# Patient Record
Sex: Female | Born: 1960 | Race: Black or African American | Hispanic: No | Marital: Married | Smoking: Never smoker
Health system: Southern US, Community
[De-identification: ages and names within clinical notes are randomized; demographics above are authoritative.]

## PROBLEM LIST (undated history)

## (undated) DIAGNOSIS — IMO0002 Reserved for concepts with insufficient information to code with codable children: Secondary | ICD-10-CM

## (undated) DIAGNOSIS — N189 Chronic kidney disease, unspecified: Secondary | ICD-10-CM

## (undated) DIAGNOSIS — I10 Essential (primary) hypertension: Secondary | ICD-10-CM

## (undated) DIAGNOSIS — Z992 Dependence on renal dialysis: Secondary | ICD-10-CM

## (undated) DIAGNOSIS — G4730 Sleep apnea, unspecified: Secondary | ICD-10-CM

## (undated) DIAGNOSIS — D649 Anemia, unspecified: Secondary | ICD-10-CM

## (undated) DIAGNOSIS — Z780 Asymptomatic menopausal state: Secondary | ICD-10-CM

## (undated) DIAGNOSIS — Z972 Presence of dental prosthetic device (complete) (partial): Secondary | ICD-10-CM

## (undated) DIAGNOSIS — E119 Type 2 diabetes mellitus without complications: Secondary | ICD-10-CM

## (undated) DIAGNOSIS — E7800 Pure hypercholesterolemia, unspecified: Secondary | ICD-10-CM

## (undated) HISTORY — DX: Type 2 diabetes mellitus without complications: E11.9

## (undated) HISTORY — DX: Asymptomatic menopausal state: Z78.0

## (undated) HISTORY — DX: Anemia, unspecified: D64.9

## (undated) HISTORY — PX: COLPOSCOPY: SHX161

## (undated) HISTORY — DX: Reserved for concepts with insufficient information to code with codable children: IMO0002

## (undated) HISTORY — PX: TUBAL LIGATION: SHX77

## (undated) HISTORY — DX: Essential (primary) hypertension: I10

## (undated) HISTORY — DX: Pure hypercholesterolemia, unspecified: E78.00

---

## 1999-09-26 DIAGNOSIS — G4733 Obstructive sleep apnea (adult) (pediatric): Secondary | ICD-10-CM | POA: Insufficient documentation

## 2004-05-07 ENCOUNTER — Other Ambulatory Visit: Payer: Self-pay

## 2004-10-19 ENCOUNTER — Ambulatory Visit: Payer: Self-pay

## 2005-09-04 ENCOUNTER — Emergency Department: Payer: Self-pay | Admitting: General Practice

## 2006-01-26 ENCOUNTER — Emergency Department: Payer: Self-pay | Admitting: Emergency Medicine

## 2006-12-20 ENCOUNTER — Ambulatory Visit: Payer: Self-pay | Admitting: Internal Medicine

## 2007-06-24 ENCOUNTER — Ambulatory Visit: Payer: Self-pay | Admitting: Gastroenterology

## 2008-01-14 ENCOUNTER — Ambulatory Visit: Payer: Self-pay | Admitting: Internal Medicine

## 2009-02-08 ENCOUNTER — Ambulatory Visit: Payer: Self-pay | Admitting: Internal Medicine

## 2010-03-26 ENCOUNTER — Emergency Department: Payer: Self-pay | Admitting: Emergency Medicine

## 2010-08-05 ENCOUNTER — Ambulatory Visit: Payer: Self-pay | Admitting: Internal Medicine

## 2012-09-29 ENCOUNTER — Emergency Department: Payer: Self-pay | Admitting: Emergency Medicine

## 2012-10-02 ENCOUNTER — Emergency Department: Payer: Self-pay | Admitting: Emergency Medicine

## 2012-12-03 ENCOUNTER — Ambulatory Visit: Payer: Self-pay

## 2013-01-07 ENCOUNTER — Emergency Department: Payer: Self-pay | Admitting: Emergency Medicine

## 2013-01-07 LAB — URINALYSIS, COMPLETE
Bacteria: NONE SEEN
Bilirubin,UR: NEGATIVE
Ketone: NEGATIVE
Leukocyte Esterase: NEGATIVE
Ph: 5 (ref 4.5–8.0)
Protein: NEGATIVE
RBC,UR: <1 /HPF (ref 0–5)
Squamous Epithelial: 2
WBC UR: <1 /HPF (ref 0–5)

## 2013-01-07 LAB — CBC WITH DIFFERENTIAL/PLATELET
Basophil #: 0 10*3/uL (ref 0.0–0.1)
HCT: 36 % (ref 35.0–47.0)
HGB: 11.9 g/dL — ABNORMAL LOW (ref 12.0–16.0)
Lymphocyte #: 1.8 10*3/uL (ref 1.0–3.6)
MCHC: 33.1 g/dL (ref 32.0–36.0)
MCV: 83 fL (ref 80–100)
Monocyte %: 6.2 %
Neutrophil #: 5 10*3/uL (ref 1.4–6.5)
Platelet: 262 10*3/uL (ref 150–440)
RBC: 4.31 10*6/uL (ref 3.80–5.20)
RDW: 14.4 % (ref 11.5–14.5)

## 2013-01-07 LAB — COMPREHENSIVE METABOLIC PANEL
Albumin: 3.6 g/dL (ref 3.4–5.0)
Alkaline Phosphatase: 107 U/L (ref 50–136)
Bilirubin,Total: 0.2 mg/dL (ref 0.2–1.0)
Calcium, Total: 9.2 mg/dL (ref 8.5–10.1)
Chloride: 106 mmol/L (ref 98–107)
Creatinine: 0.65 mg/dL (ref 0.60–1.30)
EGFR (African American): >60
EGFR (Non-African Amer.): >60
Glucose: 346 mg/dL — ABNORMAL HIGH (ref 65–99)
Potassium: 3.7 mmol/L (ref 3.5–5.1)
SGPT (ALT): 15 U/L (ref 12–78)
Sodium: 139 mmol/L (ref 136–145)
Total Protein: 7.7 g/dL (ref 6.4–8.2)

## 2013-08-13 ENCOUNTER — Ambulatory Visit: Payer: Self-pay

## 2013-11-15 ENCOUNTER — Emergency Department: Payer: Self-pay | Admitting: Emergency Medicine

## 2013-12-08 ENCOUNTER — Ambulatory Visit: Payer: Self-pay | Admitting: Internal Medicine

## 2014-01-08 LAB — URINALYSIS, COMPLETE
BACTERIA: NONE SEEN
Bilirubin,UR: NEGATIVE
Glucose,UR: 50 mg/dL (ref 0–75)
KETONE: NEGATIVE
NITRITE: NEGATIVE
Ph: 5 (ref 4.5–8.0)
SPECIFIC GRAVITY: 1.02 (ref 1.003–1.030)
Squamous Epithelial: 6
WBC UR: 4 /HPF (ref 0–5)

## 2014-01-08 LAB — COMPREHENSIVE METABOLIC PANEL
ALBUMIN: 3.5 g/dL (ref 3.4–5.0)
ALK PHOS: 88 U/L
Anion Gap: 6 — ABNORMAL LOW (ref 7–16)
BUN: 12 mg/dL (ref 7–18)
Bilirubin,Total: 0.3 mg/dL (ref 0.2–1.0)
CO2: 27 mmol/L (ref 21–32)
CREATININE: 0.73 mg/dL (ref 0.60–1.30)
Calcium, Total: 9.1 mg/dL (ref 8.5–10.1)
Chloride: 106 mmol/L (ref 98–107)
Glucose: 219 mg/dL — ABNORMAL HIGH (ref 65–99)
OSMOLALITY: 284 (ref 275–301)
Potassium: 3.4 mmol/L — ABNORMAL LOW (ref 3.5–5.1)
SGOT(AST): 19 U/L (ref 15–37)
SGPT (ALT): 19 U/L (ref 12–78)
SODIUM: 139 mmol/L (ref 136–145)
Total Protein: 7.7 g/dL (ref 6.4–8.2)

## 2014-01-08 LAB — LIPASE, BLOOD: LIPASE: 1237 U/L — AB (ref 73–393)

## 2014-01-08 LAB — CBC WITH DIFFERENTIAL/PLATELET
Basophil #: 0 10*3/uL (ref 0.0–0.1)
Basophil %: 0.3 %
EOS ABS: 0.1 10*3/uL (ref 0.0–0.7)
Eosinophil %: 0.6 %
HCT: 35 % (ref 35.0–47.0)
HGB: 11.6 g/dL — ABNORMAL LOW (ref 12.0–16.0)
LYMPHS ABS: 1.5 10*3/uL (ref 1.0–3.6)
Lymphocyte %: 16 %
MCH: 27.8 pg (ref 26.0–34.0)
MCHC: 33.1 g/dL (ref 32.0–36.0)
MCV: 84 fL (ref 80–100)
MONO ABS: 0.5 x10 3/mm (ref 0.2–0.9)
MONOS PCT: 5.6 %
NEUTROS ABS: 7.1 10*3/uL — AB (ref 1.4–6.5)
Neutrophil %: 77.5 %
Platelet: 291 10*3/uL (ref 150–440)
RBC: 4.17 10*6/uL (ref 3.80–5.20)
RDW: 14.8 % — ABNORMAL HIGH (ref 11.5–14.5)
WBC: 9.1 10*3/uL (ref 3.6–11.0)

## 2014-01-09 ENCOUNTER — Inpatient Hospital Stay: Payer: Self-pay

## 2014-01-09 LAB — COMPREHENSIVE METABOLIC PANEL
ALK PHOS: 82 U/L
AST: 25 U/L (ref 15–37)
Albumin: 3 g/dL — ABNORMAL LOW (ref 3.4–5.0)
Anion Gap: 7 (ref 7–16)
BILIRUBIN TOTAL: 0.3 mg/dL (ref 0.2–1.0)
BUN: 10 mg/dL (ref 7–18)
CHLORIDE: 107 mmol/L (ref 98–107)
Calcium, Total: 8.3 mg/dL — ABNORMAL LOW (ref 8.5–10.1)
Co2: 28 mmol/L (ref 21–32)
Creatinine: 1.13 mg/dL (ref 0.60–1.30)
EGFR (African American): >60
GFR CALC NON AF AMER: 56 — AB
GLUCOSE: 155 mg/dL — AB (ref 65–99)
Osmolality: 285 (ref 275–301)
POTASSIUM: 3.5 mmol/L (ref 3.5–5.1)
SGPT (ALT): 15 U/L (ref 12–78)
Sodium: 142 mmol/L (ref 136–145)
TOTAL PROTEIN: 7.1 g/dL (ref 6.4–8.2)

## 2014-01-09 LAB — CBC WITH DIFFERENTIAL/PLATELET
Basophil #: 0.1 10*3/uL (ref 0.0–0.1)
Basophil %: 0.5 %
EOS PCT: 0.3 %
Eosinophil #: 0 10*3/uL (ref 0.0–0.7)
HCT: 35.2 % (ref 35.0–47.0)
HGB: 11.6 g/dL — AB (ref 12.0–16.0)
LYMPHS PCT: 12.5 %
Lymphocyte #: 1.2 10*3/uL (ref 1.0–3.6)
MCH: 27.4 pg (ref 26.0–34.0)
MCHC: 32.8 g/dL (ref 32.0–36.0)
MCV: 84 fL (ref 80–100)
MONOS PCT: 7.7 %
Monocyte #: 0.8 x10 3/mm (ref 0.2–0.9)
NEUTROS ABS: 7.8 10*3/uL — AB (ref 1.4–6.5)
Neutrophil %: 79 %
PLATELETS: 275 10*3/uL (ref 150–440)
RBC: 4.22 10*6/uL (ref 3.80–5.20)
RDW: 14.5 % (ref 11.5–14.5)
WBC: 9.9 10*3/uL (ref 3.6–11.0)

## 2014-01-09 LAB — LIPID PANEL
Cholesterol: 210 mg/dL — ABNORMAL HIGH (ref 0–200)
HDL Cholesterol: 45 mg/dL (ref 40–60)
Ldl Cholesterol, Calc: 135 mg/dL — ABNORMAL HIGH (ref 0–100)
TRIGLYCERIDES: 150 mg/dL (ref 0–200)
VLDL Cholesterol, Calc: 30 mg/dL (ref 5–40)

## 2014-01-09 LAB — LIPASE, BLOOD
Lipase: 104 U/L (ref 73–393)
Lipase: 93 U/L (ref 73–393)

## 2014-01-09 LAB — TSH: THYROID STIMULATING HORM: 0.365 u[IU]/mL — AB

## 2014-01-10 LAB — HEMOGLOBIN A1C: HEMOGLOBIN A1C: 11.7 % — AB (ref 4.2–6.3)

## 2014-09-11 DIAGNOSIS — Z794 Long term (current) use of insulin: Secondary | ICD-10-CM | POA: Insufficient documentation

## 2014-09-11 DIAGNOSIS — E1169 Type 2 diabetes mellitus with other specified complication: Secondary | ICD-10-CM | POA: Insufficient documentation

## 2015-01-11 ENCOUNTER — Ambulatory Visit
Admit: 2015-01-11 | Disposition: A | Discharge status: Home / Self Care | Payer: Self-pay | Attending: Internal Medicine | Admitting: Internal Medicine

## 2015-01-16 NOTE — H&P (Signed)
PATIENT NAME:  Erin Crawford, Erin Crawford MR#:  [id number] DATE OF BIRTH:  06-Sep-1961  DATE OF ADMISSION:  01/09/2014  PRIMARY CARE PHYSICIAN: Dr. Glendon Axe.   REFERRING PHYSICIAN: Dr. Kerman Passey.   CHIEF COMPLAINT: Right-sided abdominal pain.   HISTORY OF PRESENT ILLNESS: Ms. Erin Crawford is a 54 year old African American female with past medical history of hypertension, hyperlipidemia, diabetes mellitus insulin dependent who presented to the Emergency Department with complaints of abdominal pain. Started at 2:30 yesterday afternoon. Sudden onset, 10/10 in intensity. No radiation. This was associated with nausea. Had a few episodes of vomiting. Did not have any fever. As the pain was getting worse, came to the Emergency Department. Workup in the Emergency Department with CT abdomen and pelvis showed obstructive 5 mm stone in the proximal right ureter causing moderate right hydronephrosis and perinephric fat stranding. The patient is also noted to have lipase of 1237. The patient is status post cholecystectomy. The patient states blood sugars are moderately controlled.   PAST MEDICAL HISTORY:  1. Hypertension.  2. Diabetes mellitus, insulin dependent.  3. Hyperlipidemia.   ALLERGIES: No known drug allergies.   HOME MEDICATIONS:  1. Lisinopril 40 mg once a day.  2. Lantus 25 units daily.  3. Kombiglyze 1000/2.5 mg once a day.   SOCIAL HISTORY: No history of smoking, drinking alcohol or using illicit drugs. Married, lives with her husband and daughter. Works as a [employer] manager.   FAMILY HISTORY: Mother with cancer. Father with cirrhosis secondary to alcohol use.   REVIEW OF SYSTEMS:  CONSTITUTIONAL: Generalized weakness.  EYES: No change in vision.  ENT: No change in hearing.  RESPIRATORY: No cough, shortness of breath.  CARDIOVASCULAR: No chest pain, palpations.  GASTROINTESTINAL: Has nausea, vomiting, abdominal pain.  GENITOURINARY: No dysuria or hematuria.  ENDOCRINE: No polyuria or polydipsia.   HEMATOLOGIC: No easy bruising or bleeding.  SKIN: No rash or lesions.  MUSCULOSKELETAL: No joint pains and aches.  NEUROLOGIC: No  or numbness in any part of the body.   PHYSICAL EXAMINATION:  GENERAL: This is a well-built, well-nourished, age-appropriate female lying down in the bed, not in distress.  VITAL SIGNS: Temperature 98.5, pulse 79, blood pressure 148/68, respiratory rate of 16, oxygen saturation 96% on room air.  HEENT: Head normocephalic, atraumatic. There is no scleral icterus. Conjunctivae normal. Pupils equal and react to light. Extraocular movements are intact. Mucous membranes moist. No pharyngeal erythema.  NECK: Supple. No lymphadenopathy. No JVD. No carotid bruit. No thyromegaly.  CHEST: Has no focal tenderness.  LUNGS: Bilaterally clear to auscultation.  HEART: S1, S2 regular. No murmurs are heard.  ABDOMEN: Bowel sounds present. Soft, nontender, nondistended. No hepatosplenomegaly.  EXTREMITIES: No pedal edema. Pulses 2+.  SKIN: No rash or lesions.  MUSCULOSKELETAL: Good range of motion in all of the extremities.  NEUROLOGIC: The patient is alert, oriented to place, person and time. Cranial nerves II through XII intact. Motor 5/5 in upper and lower extremities.   LABORATORIES: Lipase 1237. CMP is completely within normal limits except for glucose of 219 and potassium of 3.4. CBC is completely within normal limits. UA: RBCs of 117 and leukocyte esterase 2+. The rest of all of the values are within normal limits.   CT abdomen and pelvis: As mentioned above shows obstructive 5 mm stone within the proximal right ureter with secondary moderate right hydronephrosis and perinephric fat stranding.   ASSESSMENT AND PLAN: Ms. Erin Crawford is a 54 year old female who comes to the Emergency Department with acute pancreatitis and right ureterolithiasis  with hydronephrosis.  1. Abdominal pain: This is a combination of factors. Acute pancreatitis and right ureteral stone. Continue with  intravenous fluids and pain management.  2. Acute pancreatitis: Because the patient has diabetes mellitus, will obtain lipid profile. Possibility of triglycerides. The patient also has previous history of cholelithiasis. The patient has normal liver function tests; however, will obtain right upper quadrant ultrasound in order to rule out choledocholithiasis. Continue with intravenous fluids.  3. Right ureterolithiasis causing the hydronephrosis: Consult urology. Continue with intravenous fluids.  4. Hypokalemia: Will replace by mouth.  5. Right hydronephrosis with perinephric stranding: Will keep the patient on Rocephin. Urine shows 2+ leukocyte esterase.  6. Diabetes mellitus: Continue half of the dose of the Lantus.  7. Hypertension: Hold the blood pressure medications for now.  8. Keep the patient on deep vein thrombosis prophylaxis with Lovenox.   TIME SPENT: 50 minutes.    ____________________________ Monica Becton, MD pv:gb D: 01/09/2014 03:12:15 ET T: 01/09/2014 03:36:31 ET JOB#: [id number]  cc: Monica Becton, MD, <Dictator> Glendon Axe, MD Monica Becton MD ELECTRONICALLY SIGNED 01/09/2014 21:10

## 2015-01-16 NOTE — Consult Note (Signed)
Brief Consult Note: Diagnosis: R ureterolithiasis.   Patient was seen by consultant.   Consult note dictated.   Recommend to proceed with surgery or procedure.   Recommend further assessment or treatment.   Orders entered.   Discussed with Attending MD.   Comments: Patient is pain free at present. She would like to try to pass this stone. I will post her for stent placement Monday if she developes severe colic and does not pass stone. Dr. Deatra Ina is on call for urology this weekend if needed.  Electronic Signatures: Royston Cowper (MD)  (Signed 17-Apr-15 12:48)  Authored: Brief Consult Note   Last Updated: 17-Apr-15 12:48 by Royston Cowper (MD)

## 2015-01-16 NOTE — Consult Note (Signed)
PATIENT NAME:  Erin Crawford, Erin Crawford MR#:  [id number] DATE OF BIRTH:  1961/07/18  DATE OF CONSULTATION:  01/09/2014  REFERRING PHYSICIAN:  Monica Becton, MD CONSULTING PHYSICIAN:  Otelia Limes. Yves Dill, MD  REASON FOR CONSULTATION: Kidney stone.   HISTORY OF PRESENT ILLNESS: Erin Crawford is a 54 year old African American female with sudden onset of right-sided abdominal and right-sided flank pain. The pain prompted an Emergency Room visit earlier today, and she was found to have a 5 mm right upper ureteral stone with hydronephrosis as well as pancreatitis. This is her first kidney stone episode. At this time, she is pain-free. She denied nausea or vomiting. She denied fever, chills, or gross hematuria.   PAST MEDICAL HISTORY:   ALLERGIES: No drug allergies.   CHRONIC HOME MEDICATIONS: Include lisinopril, Lantus insulin, and Kombiglyze.  PAST SURGICAL HISTORY: No previous surgical procedures.   SOCIAL HISTORY: The patient denied tobacco or alcohol use.   FAMILY HISTORY: Negative for kidney stones or kidney disease.   PAST AND CURRENT MEDICAL CONDITIONS:  1.  Hypertension.  2.  Diabetes.  3.  Hyperlipidemia.  REVIEW OF SYSTEMS:  The patient denied hematuria, history of urinary tract infections, or urinary incontinence. She also denied chest pain or shortness of breath. She denied stroke or coronary disease.   PHYSICAL EXAMINATION: GENERAL: Obese African American female in no distress.  HEENT: Sclerae were clear. Pupils were equally round and reactive to light and accommodation.  NECK: Supple. No palpable cervical adenopathy.  LUNGS: Clear to auscultation.  CARDIOVASCULAR: Regular rhythm and rate without audible murmurs.  ABDOMEN: Soft abdomen. No CVA tenderness.   LABORATORY AND RADIOLOGICAL DATA: CT scan report dated April 17 was reviewed.   BUN was 12 and creatinine 0.73 on April 17.   White cell count was 9100, and hematocrit was 35% on April 17.   IMPRESSION: 1.  Right  ureterolithiasis.  2.  Pancreatitis.   PLAN: 1.  Increase fluid intake and provide a strainer.  2.  The patient has a high likelihood of passing this stone without intervention. She would like a trial of passage. 3.  However, should she develop recurrent colic that is not controllable, I have posted her for cystoscopy with stent placement on April 20. Otherwise, she could safely be discharged home with a strainer and analgesia and follow up in the office.  4.  Dr. Deatra Ina is on call for urology this weekend, and I suggest that you contact him if the patient develops any issues related to the stone.    ____________________________ Otelia Limes. Yves Dill, MD mrw:jcm D: 01/09/2014 12:58:06 ET T: 01/09/2014 13:13:49 ET JOB#: [id number]  cc: Otelia Limes. Yves Dill, MD, <Dictator> Royston Cowper MD ELECTRONICALLY SIGNED 01/09/2014 13:38

## 2015-01-16 NOTE — Discharge Summary (Signed)
PATIENT NAME:  Erin Crawford, Erin Crawford MR#:  [id number] DATE OF BIRTH:  04/21/1961  DATE OF ADMISSION:  01/09/2014 DATE OF DISCHARGE:  01/10/2014  PRIMARY CARE PHYSICIAN: Glendon Axe, MD   CONSULTANT: Maryan Puls, MD, urology.  DISCHARGE DIAGNOSES:  1. Right ureterolithiasis.  2. Right hydronephrosis.   DISCHARGE MEDICATIONS:  1. Lantus insulin 25 units at bedtime.  2. Lisinopril 40 mg daily.  3. Kombiglyze XR 2.01/999 two tabs daily. 4. Vicodin 5/325 q. 6 hours as needed for pain. 5. Atorvastatin 80 mg daily.  HISTORY OF PRESENT ILLNESS: This is a 54 year old female who presented to the emergency department with severe intense abdominal pain for 1 day. She had nausea and vomiting. No fever. CT abdomen and pelvis performed showed an obstructive 5-mm stone in the proximal right ureter with moderate right hydronephrosis and perinephric fat stranding. She also has an incidental finding of elevated lipase of 1237.   HOSPITAL COURSE: The patient was hospitalized and treated with IV fluids. She was given pain medications. She was monitored closely and urology was consulted. The patient was seen that day by Dr. Maryan Puls, urology. Dr. Yves Dill felt she would likely passed the stone without intervention and recommended no surgical intervention. He recommended followup in his office on April 20 for consideration of stent placement if she had still not passed the stone. The afternoon of admission her pain was significantly improved and she was pain free most of that evening. Early the next morning she had very mild pain, rated a 3/10. She had no further nausea. She was able to tolerate her diet and she was felt to be safe for discharge home.   DISCHARGE INSTRUCTIONS:  1. The patient is to follow up with Dr. Yves Dill on 01/12/2014 if she has not yet passed the stone.  2. The patient will be given a strainer for her urine to catch the stone to verify it has passed.  3. The patient should call her PCP, Dr.  Candiss Norse, if she has any further questions.   ____________________________ A. Lavone Orn, MD ams:lt D: 01/10/2014 10:24:44 ET T: 01/10/2014 21:35:13 ET JOB#: [id number]  cc: A. Lavone Orn, MD, <Dictator> Glendon Axe, MD Otelia Limes. Yves Dill, MD  Gracy Bruins SOLUM MD ELECTRONICALLY SIGNED 01/13/2014 17:34

## 2015-02-08 DIAGNOSIS — I10 Essential (primary) hypertension: Secondary | ICD-10-CM | POA: Insufficient documentation

## 2015-02-08 LAB — CBC WITH DIFFERENTIAL/PLATELET
BASOS ABS: 0 10*3/uL (ref 0–0.1)
Basophils Relative: 1 %
Eosinophils Absolute: 0.2 10*3/uL (ref 0–0.7)
Eosinophils Relative: 3 %
HCT: 33.5 % — ABNORMAL LOW (ref 35.0–47.0)
HEMOGLOBIN: 11 g/dL — AB (ref 12.0–16.0)
Lymphocytes Relative: 29 %
Lymphs Abs: 2.1 10*3/uL (ref 1.0–3.6)
MCH: 26.9 pg (ref 26.0–34.0)
MCHC: 32.9 g/dL (ref 32.0–36.0)
MCV: 81.7 fL (ref 80.0–100.0)
MONOS PCT: 6 %
Monocytes Absolute: 0.5 10*3/uL (ref 0.2–0.9)
Neutro Abs: 4.5 10*3/uL (ref 1.4–6.5)
Neutrophils Relative %: 61 %
Platelets: 277 10*3/uL (ref 150–440)
RBC: 4.1 MIL/uL (ref 3.80–5.20)
RDW: 14.4 % (ref 11.5–14.5)
WBC: 7.3 10*3/uL (ref 3.6–11.0)

## 2015-02-08 NOTE — ED Notes (Addendum)
Patient here for htn. States she was having headaches at home so she used her husbands cuff to check her bp at home. States that her diastolic was 123XX123 at home. Says she had an episode on Saturday AM where she felt like she would pass out.

## 2015-02-09 ENCOUNTER — Emergency Department
Admission: EM | Admit: 2015-02-09 | Discharge: 2015-02-09 | Disposition: A | Discharge status: Home / Self Care | Payer: 59 | Attending: Emergency Medicine | Admitting: Emergency Medicine

## 2015-02-09 DIAGNOSIS — I10 Essential (primary) hypertension: Secondary | ICD-10-CM

## 2015-02-09 LAB — COMPREHENSIVE METABOLIC PANEL
ALBUMIN: 3.7 g/dL (ref 3.5–5.0)
ALK PHOS: 70 U/L (ref 38–126)
ALT: 13 U/L — AB (ref 14–54)
AST: 14 U/L — AB (ref 15–41)
Anion gap: 11 (ref 5–15)
BUN: 27 mg/dL — ABNORMAL HIGH (ref 6–20)
CHLORIDE: 100 mmol/L — AB (ref 101–111)
CO2: 26 mmol/L (ref 22–32)
Calcium: 9.4 mg/dL (ref 8.9–10.3)
Creatinine, Ser: 1.11 mg/dL — ABNORMAL HIGH (ref 0.44–1.00)
GFR, EST NON AFRICAN AMERICAN: 56 mL/min — AB (ref >60)
Glucose, Bld: 307 mg/dL — ABNORMAL HIGH (ref 65–99)
POTASSIUM: 3.9 mmol/L (ref 3.5–5.1)
SODIUM: 137 mmol/L (ref 135–145)
Total Bilirubin: 0.5 mg/dL (ref 0.3–1.2)
Total Protein: 7.2 g/dL (ref 6.5–8.1)

## 2015-02-09 LAB — TROPONIN I: Troponin I: <0.03 ng/mL (ref <0.031)

## 2015-02-09 NOTE — Discharge Instructions (Signed)

## 2015-02-09 NOTE — ED Provider Notes (Signed)
[facility] Emergency Department Provider Note  ____________________________________________  Time seen: 2:30AM  I have reviewed the triage vital signs and the nursing notes.   HISTORY  Chief Complaint Hypertension      HPI Erin Crawford is a 54 y.o. female presents with hypertension at home patient states BP was 170/100. Patient denies any chest pain no shortness of breath no headache no nausea no vomiting no dizziness at present. Current BP 140/68. Of note recent change in the patient's blood pressure management by Dr. Gabriel Carina     No past medical history on file.  There are no active problems to display for this patient.   No past surgical history on file.  No current outpatient prescriptions on file.  Allergies Review of patient's allergies indicates no known allergies.  No family history on file.  Social History History  Substance Use Topics  . Smoking status: Not on file  . Smokeless tobacco: Not on file  . Alcohol Use: Not on file    Review of Systems  Constitutional: Negative for fever. Eyes: Negative for visual changes. ENT: Negative for sore throat. Cardiovascular: Negative for chest pain. Respiratory: Negative for shortness of breath. Gastrointestinal: Negative for abdominal pain, vomiting and diarrhea. Genitourinary: Negative for dysuria. Musculoskeletal: Negative for back pain. Skin: Negative for rash. Neurological: Negative for headaches, focal weakness or numbness.   10-point ROS otherwise negative.  ____________________________________________   PHYSICAL EXAM:  VITAL SIGNS: ED Triage Vitals  Enc Vitals Group     BP 02/08/15 2146 173/82 mmHg     Pulse Rate 02/08/15 2146 81     Resp 02/08/15 2146 16     Temp 02/08/15 2146 97.9 F (36.6 C)     Temp Source 02/08/15 2146 Oral     SpO2 02/08/15 2146 100 %     Weight 02/08/15 2146 198 lb (89.812 kg)     Height 02/08/15 2146 5\' 2"  (1.575 m)     Head Cir --     Peak Flow --      Pain Score --      Pain Loc --      Pain Edu? --      Excl. in [place]? --      Constitutional: Alert and oriented. Well appearing and in no distress. Eyes: Conjunctivae are normal. PERRL. Normal extraocular movements. ENT   Head: Normocephalic and atraumatic.   Nose: No congestion/rhinnorhea.   Mouth/Throat: Mucous membranes are moist.   Neck: No stridor. Cardiovascular: Normal rate, regular rhythm. Normal and symmetric distal pulses are present in all extremities. No murmurs, rubs, or gallops. Respiratory: Normal respiratory effort without tachypnea nor retractions. Breath sounds are clear and equal bilaterally. No wheezes/rales/rhonchi. Gastrointestinal: Soft and nontender. No distention. There is no CVA tenderness. Genitourinary: deferred Musculoskeletal: Nontender with normal range of motion in all extremities. No joint effusions.  No lower extremity tenderness nor edema. Neurologic:  Normal speech and language. No gross focal neurologic deficits are appreciated. Speech is normal.  Skin:  Skin is warm, dry and intact. No rash noted. Psychiatric: Mood and affect are normal. Speech and behavior are normal. Patient exhibits appropriate insight and judgment.  ____________________________________________    LABS (pertinent positives/negatives)  Labs Reviewed  CBC WITH DIFFERENTIAL/PLATELET - Abnormal; Notable for the following:    Hemoglobin 11.0 (*)    HCT 33.5 (*)    All other components within normal limits  COMPREHENSIVE METABOLIC PANEL - Abnormal; Notable for the following:    Chloride 100 (*)  Glucose, Bld 307 (*)    BUN 27 (*)    Creatinine, Ser 1.11 (*)    AST 14 (*)    ALT 13 (*)    GFR calc non Af Amer 56 (*)    All other components within normal limits  TROPONIN I     ____________________________________________   EKG   Date: 02/09/2015  Rate: 72  Rhythm: normal sinus rhythm  QRS Axis: normal  Intervals: normal  ST/T Wave  abnormalities: normal  Conduction Disutrbances: none  Narrative Interpretation: unremarkable         INITIAL IMPRESSION / ASSESSMENT AND PLAN / ED COURSE  Pertinent labs & imaging results that were available during my care of the patient were reviewed by me and considered in my medical decision making (see chart for details).  Patient with hypertension noted at home mildly hypertensive in the emergency department. We'll refer patient to Dr. Conley Canal for outpatient management of BP  ____________________________________________   FINAL CLINICAL IMPRESSION(S) / ED DIAGNOSES  Final diagnoses:  Uncontrolled hypertension      Gregor Hams, MD 02/09/15 [phone]

## 2015-08-09 ENCOUNTER — Encounter: Payer: Self-pay | Admitting: *Deleted

## 2015-08-17 ENCOUNTER — Ambulatory Visit (INDEPENDENT_AMBULATORY_CARE_PROVIDER_SITE_OTHER): Payer: 59 | Admitting: Obstetrics and Gynecology

## 2015-08-17 ENCOUNTER — Other Ambulatory Visit: Payer: Self-pay | Admitting: Obstetrics and Gynecology

## 2015-08-17 ENCOUNTER — Encounter: Payer: Self-pay | Admitting: Obstetrics and Gynecology

## 2015-08-17 VITALS — BP 156/73 | HR 100 | Wt 207.5 lb

## 2015-08-17 DIAGNOSIS — Z01419 Encounter for gynecological examination (general) (routine) without abnormal findings: Secondary | ICD-10-CM

## 2015-08-17 DIAGNOSIS — B359 Dermatophytosis, unspecified: Secondary | ICD-10-CM

## 2015-08-17 MED ORDER — KETOCONAZOLE 2 % EX SHAM: 1.0000 "application " | MEDICATED_SHAMPOO | Freq: Once | CUTANEOUS | Status: DC

## 2015-08-17 NOTE — Patient Instructions (Signed)
  Place annual gynecologic exam patient instructions here.  Thank you for enrolling in [facility]. Please follow the instructions below to securely access your online medical record. MyChart allows you to send messages to your doctor, view your test results, manage appointments, and more.   How Do I Sign Up? 1. In your Internet browser, go to [employer] and enter [email or web address]. 2. Click on the Sign Up Now link in the Sign In box. You will see the New Member Sign Up page. 3. Enter your MyChart Access Code exactly as it appears below. You will not need to use this code after you've completed the sign-up process. If you do not sign up before the expiration date, you must request a new code.  MyChart Access Code: [id number] Expires: 10/15/2015  9:51 AM  4. Enter your Social Security Number ([id number]) and Date of Birth (mm/dd/yyyy) as indicated and click Submit. You will be taken to the next sign-up page. 5. Create a MyChart ID. This will be your MyChart login ID and cannot be changed, so think of one that is secure and easy to remember. 6. Create a MyChart password. You can change your password at any time. 7. Enter your Password Reset Question and Answer. This can be used at a later time if you forget your password.  8. Enter your e-mail address. You will receive e-mail notification when new information is available in [place]. 9. Click Sign Up. You can now view your medical record.   Additional Information Remember, MyChart is NOT to be used for urgent needs. For medical emergencies, dial 911.

## 2015-08-18 ENCOUNTER — Telehealth: Payer: Self-pay | Admitting: Obstetrics and Gynecology

## 2015-08-18 ENCOUNTER — Other Ambulatory Visit: Payer: Self-pay | Admitting: Obstetrics and Gynecology

## 2015-08-18 LAB — COMPREHENSIVE METABOLIC PANEL
ALK PHOS: 94 IU/L (ref 39–117)
ALT: 9 IU/L (ref 0–32)
AST: 13 IU/L (ref 0–40)
Albumin/Globulin Ratio: 1.4 (ref 1.1–2.5)
Albumin: 4.1 g/dL (ref 3.5–5.5)
BUN/Creatinine Ratio: 21 (ref 9–23)
BUN: 20 mg/dL (ref 6–24)
CO2: 26 mmol/L (ref 18–29)
CREATININE: 0.94 mg/dL (ref 0.57–1.00)
Calcium: 9.8 mg/dL (ref 8.7–10.2)
Chloride: 99 mmol/L (ref 97–106)
GFR calc Af Amer: 80 mL/min/{1.73_m2} (ref >59)
GFR calc non Af Amer: 69 mL/min/{1.73_m2} (ref >59)
GLUCOSE: 199 mg/dL — AB (ref 65–99)
Globulin, Total: 3 g/dL (ref 1.5–4.5)
Potassium: 3.9 mmol/L (ref 3.5–5.2)
Sodium: 140 mmol/L (ref 136–144)
TOTAL PROTEIN: 7.1 g/dL (ref 6.0–8.5)

## 2015-08-18 LAB — THYROID PANEL WITH TSH
Free Thyroxine Index: 2.2 (ref 1.2–4.9)
T3 Uptake Ratio: 27 % (ref 24–39)
T4 TOTAL: 8.3 ug/dL (ref 4.5–12.0)
TSH: 1.01 u[IU]/mL (ref 0.450–4.500)

## 2015-08-18 LAB — LIPID PANEL W/O CHOL/HDL RATIO
CHOLESTEROL TOTAL: 289 mg/dL — AB (ref 100–199)
HDL: 49 mg/dL (ref >39)
LDL Calculated: 198 mg/dL — ABNORMAL HIGH (ref 0–99)
TRIGLYCERIDES: 209 mg/dL — AB (ref 0–149)
VLDL Cholesterol Cal: 42 mg/dL — ABNORMAL HIGH (ref 5–40)

## 2015-08-18 LAB — HGB A1C W/O EAG: HEMOGLOBIN A1C: 10.2 % — AB (ref 4.8–5.6)

## 2015-08-18 LAB — VITAMIN D 25 HYDROXY (VIT D DEFICIENCY, FRACTURES): Vit D, 25-Hydroxy: 23 ng/mL — ABNORMAL LOW (ref 30.0–100.0)

## 2015-08-18 LAB — CYTOLOGY - PAP

## 2015-08-18 MED ORDER — FLUCONAZOLE 100 MG PO TABS: 100.0000 mg | ORAL_TABLET | Freq: Every day | ORAL | Status: DC

## 2015-08-18 NOTE — Telephone Encounter (Signed)
PT CALLED AND SHE THOUGHT THAT A ORAL MEDICAINE WAS GOING TO BE CALLED IN, SHE GOT THE SHAMPOO FOR HER SKIN BUT WANTED TO CHECK ABOUT THE ORAL.

## 2015-08-23 ENCOUNTER — Telehealth: Payer: Self-pay | Admitting: *Deleted

## 2015-08-23 NOTE — Telephone Encounter (Signed)
-----   Message from Joylene Igo, [place] sent at 08/21/2015  8:01 AM EST ----- Please let her know her pap and HPV were both negative

## 2015-08-23 NOTE — Telephone Encounter (Signed)
Notified pt of results 

## 2015-09-16 NOTE — Telephone Encounter (Signed)
-----   Message from Joylene Igo, [place] sent at 08/24/2015  4:07 PM EST ----- Please mail copy of labs to her for her next appt with PCP, let her know she needs to continue with Vit D supplements and weight loss, along with getting diabetes under control.Marland Kitchen

## 2015-09-16 NOTE — Telephone Encounter (Signed)
Notified pt of results 

## 2015-10-19 NOTE — Progress Notes (Signed)
Subjective:   Erin Crawford is a 55 y.o. No obstetric history on file. Caucasian female here for a routine well-woman exam.  No LMP recorded. Patient is postmenopausal.    Current complaints: rash on buttuck PCP: Candiss Norse       does desire labs  Social History: Sexual: heterosexual Marital Status: married Living situation: with family Occupation: unknown occupation Tobacco/alcohol: no tobacco use Illicit drugs: no history of illicit drug use  The following portions of the patient's history were reviewed and updated as appropriate: allergies, current medications, past family history, past medical history, past social history, past surgical history and problem list.  Past Medical History Past Medical History  Diagnosis Date  . Hypertension   . Diabetes mellitus without complication ([place])   . Menopause   . Hypercholesteremia   . LGSIL (low grade squamous intraepithelial dysplasia)     Past Surgical History Past Surgical History  Procedure Laterality Date  . Colposcopy    . Tubal ligation      Gynecologic History No obstetric history on file.  No LMP recorded. Patient is postmenopausal. Contraception: post menopausal status Last Pap: 2015. Results were: normal Last mammogram: 2015. Results were: normal  Obstetric History OB History  No data available    Current Medications No current outpatient prescriptions on file prior to visit.   No current facility-administered medications on file prior to visit.    Review of Systems Patient denies any headaches, blurred vision, shortness of breath, chest pain, abdominal pain, problems with bowel movements, urination, or intercourse.  Objective:  BP 156/73 mmHg  Pulse 100  Wt 207 lb 8 oz (94.121 kg) Physical Exam  General:  Well developed, well nourished, no acute distress. She is alert and oriented x3. Skin:  Warm and dry Neck:  Midline trachea, no thyromegaly or nodules Cardiovascular: Regular rate and rhythm, no murmur  heard Lungs:  Effort normal, all lung fields clear to auscultation bilaterally Breasts:  No dominant palpable mass, retraction, or nipple discharge Abdomen:  Soft, non tender, no hepatosplenomegaly or masses Pelvic:  External genitalia is normal in appearance.  The vagina is normal in appearance. The cervix is bulbous, no CMT.  Thin prep pap is not done . Uterus is felt to be normal size, shape, and contour.  No adnexal masses or tenderness noted. Extremities:  No swelling or varicosities noted Psych:  She has a normal mood and affect  Assessment:   Healthy well-woman exam  Plan:   F/U 1 year for AE, or sooner if needed Mammogram scheduled  Kayo Zion Rockney Ghee, CNM

## 2016-07-20 DIAGNOSIS — I6523 Occlusion and stenosis of bilateral carotid arteries: Secondary | ICD-10-CM | POA: Insufficient documentation

## 2016-07-26 ENCOUNTER — Other Ambulatory Visit: Payer: Self-pay | Admitting: Internal Medicine

## 2016-07-26 ENCOUNTER — Other Ambulatory Visit: Payer: Self-pay | Admitting: Nurse Practitioner

## 2016-07-26 DIAGNOSIS — Z1231 Encounter for screening mammogram for malignant neoplasm of breast: Secondary | ICD-10-CM

## 2016-08-28 ENCOUNTER — Ambulatory Visit: Payer: 59

## 2016-10-03 ENCOUNTER — Ambulatory Visit: Payer: 59

## 2016-10-18 ENCOUNTER — Encounter: Payer: 59 | Admitting: Obstetrics and Gynecology

## 2016-10-30 ENCOUNTER — Ambulatory Visit
Admission: RE | Admit: 2016-10-30 | Discharge: 2016-10-30 | Disposition: A | Discharge status: Home / Self Care | Payer: 59 | Source: Ambulatory Visit | Attending: Nurse Practitioner | Admitting: Nurse Practitioner

## 2016-10-30 DIAGNOSIS — Z1231 Encounter for screening mammogram for malignant neoplasm of breast: Secondary | ICD-10-CM | POA: Diagnosis not present

## 2017-03-08 ENCOUNTER — Emergency Department
Admission: EM | Admit: 2017-03-08 | Discharge: 2017-03-08 | Disposition: A | Discharge status: Home / Self Care | Payer: 59 | Attending: Emergency Medicine | Admitting: Emergency Medicine

## 2017-03-08 DIAGNOSIS — I10 Essential (primary) hypertension: Secondary | ICD-10-CM | POA: Insufficient documentation

## 2017-03-08 DIAGNOSIS — M5441 Lumbago with sciatica, right side: Secondary | ICD-10-CM

## 2017-03-08 DIAGNOSIS — Z794 Long term (current) use of insulin: Secondary | ICD-10-CM | POA: Insufficient documentation

## 2017-03-08 DIAGNOSIS — E119 Type 2 diabetes mellitus without complications: Secondary | ICD-10-CM | POA: Insufficient documentation

## 2017-03-08 DIAGNOSIS — Z79899 Other long term (current) drug therapy: Secondary | ICD-10-CM | POA: Insufficient documentation

## 2017-03-08 DIAGNOSIS — M79604 Pain in right leg: Secondary | ICD-10-CM | POA: Diagnosis present

## 2017-03-08 MED ORDER — KETOROLAC TROMETHAMINE 60 MG/2ML IM SOLN
30.0000 mg | Freq: Once | INTRAMUSCULAR | Status: AC
  Administered 2017-03-08: 30 mg via INTRAMUSCULAR

## 2017-03-08 MED ORDER — CYCLOBENZAPRINE HCL 5 MG PO TABS: 5.0000 mg | ORAL_TABLET | Freq: Three times a day (TID) | ORAL | 0 refills | Status: AC | PRN

## 2017-03-08 MED ORDER — IBUPROFEN 800 MG PO TABS: 800.0000 mg | ORAL_TABLET | Freq: Three times a day (TID) | ORAL | 0 refills | Status: DC | PRN

## 2017-03-08 MED ORDER — KETOROLAC TROMETHAMINE 30 MG/ML IJ SOLN
INTRAMUSCULAR | Status: AC
  Filled 2017-03-08: qty 1

## 2017-03-08 NOTE — ED Triage Notes (Signed)
Pt states that she went walking with a friend last night and started having some pain in her right upper leg that shoots down to her knee.  Pt states that the pain has become increasingly worse throughout the day.  Pt states she took meloxicam 15mg  at home and tylenol/codeine 300/30mg  at home PTA with very little relief.  Pt A&Ox4, in NAD, ambulatory to triage.

## 2017-03-08 NOTE — ED Notes (Signed)
Patient has a tingling pain down her right leg. Patient is able to feel my touch and pulses are intact. Patient states the pain is better if she is up moving around but if she sits for a while the pain is worse and she is unable to get comfortable and gets stiff.

## 2017-03-08 NOTE — ED Provider Notes (Signed)
[facility] Emergency Department Provider Note  ____________________________________________  Time seen: Approximately 11:16 PM  I have reviewed the triage vital signs and the nursing notes.   HISTORY  Chief Complaint Leg Pain    HPI Erin Crawford is a 56 y.o. female that presents to the emergency department with right buttocks pain that radiates into the back of her right thigh down to the top of her knee. She states that the back of her thigh tingles. No trauma. Pain started today and has gotten worse with ambulation. Patient walked 15 minutes per day but she used to walk 30 minutes per day. She has uncontrolled diabetes. Before she got to the emergency department she checked her blood sugar and it was 300. Her A1c is 11. She takes metformin and uses insulin. She has taken meloxicam and Tylenol 3 without relief. Patient denies shortness of breath, chest pain, nausea, vomiting, abdominal pain, bowel or bladder dysfunction, saddle paresthesias, dysuria, urgency, frequency, calf pain.   Past Medical History:  Diagnosis Date  . Diabetes mellitus without complication ([place])   . Hypercholesteremia   . Hypertension   . LGSIL (low grade squamous intraepithelial dysplasia)   . Menopause     There are no active problems to display for this patient.   Past Surgical History:  Procedure Laterality Date  . COLPOSCOPY    . TUBAL LIGATION      Prior to Admission medications   Medication Sig Start Date End Date Taking? Authorizing Provider  Cholecalciferol (VITAMIN D-1000 MAX ST) 1000 UNITS tablet Take 1 tablet by mouth daily.    [provider]  cyclobenzaprine (FLEXERIL) 5 MG tablet Take 1 tablet (5 mg total) by mouth 3 (three) times daily as needed for muscle spasms. 03/08/17 03/15/17  Laban Emperor, PA-C  fluconazole (DIFLUCAN) 100 MG tablet Take 1 tablet (100 mg total) by mouth daily. 08/18/15   Shambley, Melody N, CNM  glucose blood (ONE TOUCH ULTRA  TEST) test strip Use 2 (two) times daily. Use as instructed. 12/29/14   [provider]  ibuprofen (ADVIL,MOTRIN) 800 MG tablet Take 1 tablet (800 mg total) by mouth every 8 (eight) hours as needed. 03/08/17   Laban Emperor, PA-C  Insulin Glargine (LANTUS SOLOSTAR) 100 UNIT/ML Solostar Pen Inject 40 Units into the skin Nightly. 07/14/15   [provider]  ketoconazole (NIZORAL) 2 % shampoo Apply 1 application topically once. As directed to affected areas daily x 2 weeks then as needed 08/17/15   Shambley, Melody N, CNM  lisinopril-hydrochlorothiazide (PRINZIDE,ZESTORETIC) 20-12.5 MG tablet Take 2 tablets by mouth daily. 03/15/15 03/14/16  [provider]  metoprolol (LOPRESSOR) 50 MG tablet Take 1 tablet by mouth 2 (two) times daily. 07/28/15 07/27/16  [provider]  Multiple Vitamin (MULTI-VITAMINS) TABS Take 1 tablet by mouth daily.    [provider]  Saxagliptin-Metformin (KOMBIGLYZE XR) 2.01-999 MG TB24 Take 1 tablet by mouth 2 (two) times daily. 07/14/15   [provider]    Allergies Patient has no known allergies.  Family History  Problem Relation Age of Onset  . Cancer Mother        colon  . Cancer Father        lung  . Diabetes Maternal Grandmother     Social History Social History  Substance Use Topics  . Smoking status: Never Smoker  . Smokeless tobacco: Never Used  . Alcohol use No     Review of Systems  Constitutional: No fever/chills Cardiovascular: No chest  pain. Respiratory:  No SOB. Gastrointestinal: No abdominal pain.  No nausea, no vomiting.  Skin: Negative for rash, abrasions, lacerations, ecchymosis. Neurological: Negative for headaches, numbness or tingling   ____________________________________________   PHYSICAL EXAM:  VITAL SIGNS: ED Triage Vitals  Enc Vitals Group     BP 03/08/17 2044 (!) 185/87     Pulse Rate 03/08/17 2044 93     Resp 03/08/17 2044 18     Temp 03/08/17 2044 99 F (37.2 C)      Temp Source 03/08/17 2044 Oral     SpO2 03/08/17 2044 98 %     Weight 03/08/17 2045 200 lb (90.7 kg)     Height 03/08/17 2045 5\' 2"  (1.575 m)     Head Circumference --      Peak Flow --      Pain Score 03/08/17 2043 9     Pain Loc --      Pain Edu? --      Excl. in [place]? --      Constitutional: Alert and oriented. Well appearing and in no acute distress. Eyes: Conjunctivae are normal. PERRL. EOMI. Head: Atraumatic. ENT:      Ears:      Nose: No congestion/rhinnorhea.      Mouth/Throat: Mucous membranes are moist.  Neck: No stridor.   Cardiovascular: Normal rate, regular rhythm.  Good peripheral circulation. Respiratory: Normal respiratory effort without tachypnea or retractions. Lungs CTAB. Good air entry to the bases with no decreased or absent breath sounds. Musculoskeletal: Full range of motion to all extremities. No gross deformities appreciated. No tenderness to palpation over lumbar spine. Negative straight leg raise and cross leg raise. Tenderness to palpation in right buttocks. No calf pain to palpation. Neurologic:  Normal speech and language. No gross focal neurologic deficits are appreciated.  Skin:  Skin is warm, dry and intact. No rash noted.   ____________________________________________   LABS (all labs ordered are listed, but only abnormal results are displayed)  Labs Reviewed - No data to display ____________________________________________  EKG   ____________________________________________  RADIOLOGY  No results found.  ____________________________________________    PROCEDURES  Procedure(s) performed:    Procedures    Medications - No data to display   ____________________________________________   INITIAL IMPRESSION / ASSESSMENT AND PLAN / ED COURSE  Pertinent labs & imaging results that were available during my care of the patient were reviewed by me and considered in my medical decision making (see chart for  details).  Review of the [place] CSRS was performed in accordance of the [place] prior to dispensing any controlled drugs.     Patient's diagnosis is consistent with sciatica. Vital signs and exam are reassuring. She denies bowel or bladder dysfunction or saddle paresthesias. Patient has uncontrolled diabetes so I do not want to start steroids at this time. She is requesting a work note for [employer]. Patient will be discharged home with prescriptions for ibuprofen and Flexeril. Patient is to follow up with PCP as directed. Patient is given ED precautions to return to the ED for any worsening or new symptoms.     ____________________________________________  FINAL CLINICAL IMPRESSION(S) / ED DIAGNOSES  Final diagnoses:  Acute right-sided low back pain with right-sided sciatica      NEW MEDICATIONS STARTED DURING THIS VISIT:  New Prescriptions   CYCLOBENZAPRINE (FLEXERIL) 5 MG TABLET    Take 1 tablet (5 mg total) by mouth 3 (three) times daily as needed for muscle spasms.   IBUPROFEN (ADVIL,MOTRIN) 800  MG TABLET    Take 1 tablet (800 mg total) by mouth every 8 (eight) hours as needed.        This chart was dictated using voice recognition software/Dragon. Despite best efforts to proofread, errors can occur which can change the meaning. Any change was purely unintentional.    Laban Emperor, PA-C 03/08/17 2330    Laban Emperor, PA-C 03/08/17 5284    Hinda Kehr, MD 03/08/17 2337

## 2017-12-10 ENCOUNTER — Other Ambulatory Visit: Payer: Self-pay | Admitting: Nurse Practitioner

## 2017-12-10 DIAGNOSIS — Z1231 Encounter for screening mammogram for malignant neoplasm of breast: Secondary | ICD-10-CM

## 2017-12-17 ENCOUNTER — Ambulatory Visit
Admission: RE | Admit: 2017-12-17 | Discharge: 2017-12-17 | Disposition: A | Discharge status: Home / Self Care | Payer: Managed Care, Other (non HMO) | Source: Ambulatory Visit | Attending: Nurse Practitioner | Admitting: Nurse Practitioner

## 2017-12-17 DIAGNOSIS — Z1231 Encounter for screening mammogram for malignant neoplasm of breast: Secondary | ICD-10-CM | POA: Diagnosis not present

## 2018-10-15 ENCOUNTER — Other Ambulatory Visit: Payer: Self-pay | Admitting: Nurse Practitioner

## 2018-10-15 DIAGNOSIS — Z1231 Encounter for screening mammogram for malignant neoplasm of breast: Secondary | ICD-10-CM

## 2018-11-29 ENCOUNTER — Inpatient Hospital Stay: Payer: Managed Care, Other (non HMO) | Attending: Oncology | Admitting: Oncology

## 2018-11-29 ENCOUNTER — Other Ambulatory Visit: Payer: Self-pay

## 2018-11-29 ENCOUNTER — Encounter: Payer: Self-pay | Admitting: Oncology

## 2018-11-29 ENCOUNTER — Encounter (INDEPENDENT_AMBULATORY_CARE_PROVIDER_SITE_OTHER): Payer: Self-pay

## 2018-11-29 VITALS — BP 159/74 | HR 67 | Temp 96.5°F | Resp 18 | Ht 62.0 in | Wt 194.7 lb

## 2018-11-29 DIAGNOSIS — D631 Anemia in chronic kidney disease: Secondary | ICD-10-CM

## 2018-11-29 DIAGNOSIS — Z800 Family history of malignant neoplasm of digestive organs: Secondary | ICD-10-CM

## 2018-11-29 DIAGNOSIS — Z129 Encounter for screening for malignant neoplasm, site unspecified: Secondary | ICD-10-CM

## 2018-11-29 DIAGNOSIS — D649 Anemia, unspecified: Secondary | ICD-10-CM | POA: Insufficient documentation

## 2018-11-29 DIAGNOSIS — N184 Chronic kidney disease, stage 4 (severe): Secondary | ICD-10-CM | POA: Diagnosis not present

## 2018-11-29 NOTE — Progress Notes (Signed)
Hematology/Oncology Consult note [facility] [phone] [phone]   Patient Care Team: [name], NP as PCP - General (Internal Medicine)  REFERRING PROVIDER: Dr.Lateef CHIEF COMPLAINTS/REASON FOR VISIT:  Evaluation of anemia  HISTORY OF PRESENTING ILLNESS:  Erin Crawford is a  58 y.o.  female with PMH listed below who was referred to me for evaluation of anemia Reviewed patient's recent labs.  Labs revealed anemia with hemoglobin of 9.6, MCV 82, wbc 8.8, platelet  331,000.   Cr 1.81, eGFR 36. Bilirubin <0.2,  Reviewed patient's previous labs ordered by primary care physician's office, anemia is chronic onset , duration is since 2014.  No aggravating or improving factors.  Associated signs and symptoms: Patient reports fatigue. Denies SOB with exertion.  Denies weight loss, easy bruising, hematochezia, hemoptysis, hematuria. Context: History of GI bleeding: denies               History of Chronic kidney disease: stage III CKD               History of autoimmune disease: denies                Feels cold all the time. Fatigue: reports worsening fatigue. Chronic onset, perisistent, no aggravating or improving factors, no associated symptoms.    Review of Systems  Constitutional: Positive for fatigue. Negative for appetite change, chills and fever.  HENT:   Negative for hearing loss and voice change.   Eyes: Negative for eye problems.  Respiratory: Negative for chest tightness and cough.   Cardiovascular: Negative for chest pain.  Gastrointestinal: Negative for abdominal distention, abdominal pain and blood in stool.  Endocrine: Negative for hot flashes.  Genitourinary: Negative for difficulty urinating and frequency.   Musculoskeletal: Negative for arthralgias.  Skin: Negative for itching and rash.  Neurological: Negative for extremity weakness.  Hematological: Negative for adenopathy.  Psychiatric/Behavioral: Negative  for confusion.    MEDICAL HISTORY:  Past Medical History:  Diagnosis Date  . Diabetes mellitus without complication ([place])   . Hypercholesteremia   . Hypertension   . LGSIL (low grade squamous intraepithelial dysplasia)   . Menopause     SURGICAL HISTORY: Past Surgical History:  Procedure Laterality Date  . COLPOSCOPY    . TUBAL LIGATION      SOCIAL HISTORY: Social History   Socioeconomic History  . Marital status: Married    Spouse name: Not on file  . Number of children: Not on file  . Years of education: Not on file  . Highest education level: Not on file  Occupational History  . Not on file  Social Needs  . Financial resource strain: Not on file  . Food insecurity:    Worry: Not on file    Inability: Not on file  . Transportation needs:    Medical: Not on file    Non-medical: Not on file  Tobacco Use  . Smoking status: Never Smoker  . Smokeless tobacco: Never Used  Substance and Sexual Activity  . Alcohol use: No  . Drug use: No  . Sexual activity: Yes  Lifestyle  . Physical activity:    Days per week: Not on file    Minutes per session: Not on file  . Stress: Not on file  Relationships  . Social connections:    Talks on phone: Not on file    Gets together: Not on file    Attends religious service: Not on file    Active member of  club or organization: Not on file    Attends meetings of clubs or organizations: Not on file    Relationship status: Not on file  . Intimate partner violence:    Fear of current or ex partner: Not on file    Emotionally abused: Not on file    Physically abused: Not on file    Forced sexual activity: Not on file  Other Topics Concern  . Not on file  Social History Narrative  . Not on file    FAMILY HISTORY: Family History  Problem Relation Age of Onset  . Cancer Mother        colon  . Cancer Father        lung  . Cirrhosis Father   . Diabetes Maternal Grandmother   . Breast cancer Neg Hx     ALLERGIES:  has  No Known Allergies.  MEDICATIONS:  Current Outpatient Medications  Medication Sig Dispense Refill  . amLODipine (NORVASC) 10 MG tablet TAKE 1 TABLET BY MOUTH ONCE DAILY    . atorvastatin (LIPITOR) 20 MG tablet Take 20 mg by mouth daily at 6 PM.    . carvedilol (COREG) 6.25 MG tablet Take 6.25 mg by mouth 2 (two) times daily.    . Cholecalciferol (VITAMIN D-1000 MAX ST) 1000 UNITS tablet Take 1 tablet by mouth daily.    . dorzolamide-timolol (COSOPT) 22.3-6.8 MG/ML ophthalmic solution INSTILL 1 DROP INTO EACH EYE TWICE DAILY    . Dulaglutide 1.5 MG/0.5ML SOPN Inject into the skin. Inject 1.5 mg subcutaneously every 7 days    . fluconazole (DIFLUCAN) 100 MG tablet Take 1 tablet (100 mg total) by mouth daily. 14 tablet 1  . glucose blood (ONE TOUCH ULTRA TEST) test strip Use 2 (two) times daily. Use as instructed.    . hydrALAZINE (APRESOLINE) 25 MG tablet Take 1 tablet by mouth 3 (three) times daily.    . hydrochlorothiazide (HYDRODIURIL) 25 MG tablet TAKE 1 TABLET BY MOUTH ONCE DAILY    . ibuprofen (ADVIL,MOTRIN) 800 MG tablet Take 1 tablet (800 mg total) by mouth every 8 (eight) hours as needed. 30 tablet 0  . Insulin Glargine, 2 Unit Dial, 300 UNIT/ML SOPN Inject into the skin.    Marland Kitchen lisinopril (PRINIVIL,ZESTRIL) 40 MG tablet TAKE 1 TABLET BY MOUTH ONCE DAILY    . Multiple Vitamin (MULTI-VITAMINS) TABS Take 1 tablet by mouth daily.    Marland Kitchen lisinopril-hydrochlorothiazide (PRINZIDE,ZESTORETIC) 20-12.5 MG tablet Take 2 tablets by mouth daily.     No current facility-administered medications for this visit.      PHYSICAL EXAMINATION: ECOG PERFORMANCE STATUS: 1 - Symptomatic but completely ambulatory Vitals:   11/29/18 0936  BP: (!) 159/74  Pulse: 67  Resp: 18  Temp: (!) 96.5 F (35.8 C)   Filed Weights   11/29/18 0936  Weight: 194 lb 11.2 oz (88.3 kg)    Physical Exam Constitutional:      General: She is not in acute distress.    Appearance: She is obese.  HENT:     Head:  Normocephalic and atraumatic.  Eyes:     General: No scleral icterus.    Pupils: Pupils are equal, round, and reactive to light.  Neck:     Musculoskeletal: Normal range of motion and neck supple.  Cardiovascular:     Rate and Rhythm: Normal rate and regular rhythm.     Heart sounds: Normal heart sounds.  Pulmonary:     Effort: Pulmonary effort is normal. No respiratory distress.  Breath sounds: No wheezing.  Abdominal:     General: Bowel sounds are normal. There is no distension.     Palpations: Abdomen is soft. There is no mass.     Tenderness: There is no abdominal tenderness.  Musculoskeletal: Normal range of motion.        General: No deformity.  Skin:    General: Skin is warm and dry.     Findings: No erythema or rash.  Neurological:     Mental Status: She is alert and oriented to person, place, and time.     Cranial Nerves: No cranial nerve deficit.     Coordination: Coordination normal.  Psychiatric:        Behavior: Behavior normal.        Thought Content: Thought content normal.      LABORATORY DATA:  I have reviewed the data as listed Lab Results  Component Value Date   WBC 7.3 02/08/2015   HGB 11.0 (L) 02/08/2015   HCT 33.5 (L) 02/08/2015   MCV 81.7 02/08/2015   PLT 277 02/08/2015   No results for input(s): NA, K, CL, CO2, GLUCOSE, BUN, CREATININE, CALCIUM, GFRNONAA, GFRAA, PROT, ALBUMIN, AST, ALT, ALKPHOS, BILITOT, BILIDIR, IBILI in the last 8760 hours. Iron/TIBC/Ferritin/ %Sat No results found for: IRON, TIBC, FERRITIN, IRONPCTSAT   RADIOGRAPHIC STUDIES: I have personally reviewed the radiological images as listed and agreed with the findings in the report. 12/17/2017 screening mammogram bilateral:  No mammographic findings suspicious for malignancy.   ASSESSMENT & PLAN:  1. Anemia due to stage 4 chronic kidney disease ([place])   2. Family history of colon cancer in mother   67. Cancer screening    Discussed with patient that anemia is most likely  due to chronic kidney disease, other etiology such as chronic blood loss, hyper/hypothyroidism, nutritional deficiency, infection/chronic inflammation, hemolysis, underlying bone marrow disorders. Will check CBC w differential,  vitamin B12, Folate, iron/TIBC, ferritin,  blood smear, TSH,  LDH, haptoglobin, monoclonal gammopathy evaluation.   [facility] requisition was provided to patient.   She can follow up in 2-3 weeks to discuss results.   # family history of colon cancer, Cancer appropriate screening: recommend colonoscopy. She is also due to annual mammogram which is scheduled.   Orders Placed This Encounter  Procedures  . CBC with Differential/Platelet    Standing Status:   Future    Standing Expiration Date:   11/29/2019  . Technologist smear review    Standing Status:   Future    Standing Expiration Date:   11/29/2019  . Retic Panel    Standing Status:   Future    Standing Expiration Date:   11/29/2019  . Iron and TIBC    Standing Status:   Future    Standing Expiration Date:   11/29/2019  . Ferritin    Standing Status:   Future    Standing Expiration Date:   11/29/2019  . Multiple Myeloma Panel (SPEP&IFE w/QIG)    Standing Status:   Future    Standing Expiration Date:   11/29/2019  . Kappa/lambda light chains    Standing Status:   Future    Standing Expiration Date:   11/29/2019    All questions were answered. The patient knows to call the clinic with any problems questions or concerns.  Return of visit: 2 -3weeks Thank you for this kind referral and the opportunity to participate in the care of this patient. A copy of today's note is routed to referring provider  Total  face to face encounter time for this patient visit was 58mn. >50% of the time was  spent in counseling and coordination of care.    ZEarlie Server MD, PhD Hematology Oncology [facility]at [facility]ex[facility] Center PlanoPager- [phone]/02/2019

## 2018-11-29 NOTE — Progress Notes (Signed)
Patient here for initial visit. Complains of having a lot of gas. Pt states that hands stay cold all the time.

## 2018-12-10 ENCOUNTER — Encounter: Payer: Self-pay | Admitting: Oncology

## 2018-12-11 ENCOUNTER — Encounter: Payer: Self-pay | Admitting: Oncology

## 2018-12-17 ENCOUNTER — Telehealth: Payer: Self-pay | Admitting: *Deleted

## 2018-12-17 ENCOUNTER — Other Ambulatory Visit: Payer: Self-pay

## 2018-12-17 NOTE — Telephone Encounter (Signed)
Per MD/ Almyra Free 12/17/18 Skype message to schedule patient for televisit on 12/18/18 Patient appt was R/S from 3/26 to 3/25  Tried calling pt. to make her aware of the scheduled visit, a detailed message was left on patient's vmail

## 2018-12-18 ENCOUNTER — Inpatient Hospital Stay (HOSPITAL_BASED_OUTPATIENT_CLINIC_OR_DEPARTMENT_OTHER): Payer: Managed Care, Other (non HMO) | Admitting: Oncology

## 2018-12-18 DIAGNOSIS — I10 Essential (primary) hypertension: Secondary | ICD-10-CM

## 2018-12-18 DIAGNOSIS — N184 Chronic kidney disease, stage 4 (severe): Secondary | ICD-10-CM | POA: Diagnosis not present

## 2018-12-18 DIAGNOSIS — Z794 Long term (current) use of insulin: Secondary | ICD-10-CM

## 2018-12-18 DIAGNOSIS — E119 Type 2 diabetes mellitus without complications: Secondary | ICD-10-CM

## 2018-12-18 DIAGNOSIS — D631 Anemia in chronic kidney disease: Secondary | ICD-10-CM

## 2018-12-18 DIAGNOSIS — Z79899 Other long term (current) drug therapy: Secondary | ICD-10-CM

## 2018-12-18 NOTE — Progress Notes (Addendum)
Virtual Visit via Telephone Note  I connected with Erin Crawford on 12/18/18 at  9:30 AM EDT by telephone and verified that I am speaking with the correct person using two identifiers.   I discussed the limitations, risks, security and privacy concerns of performing an evaluation and management service by telephone and the availability of in person appointments. I also discussed with the patient that there may be a patient responsible charge related to this service. The patient expressed understanding and agreed to proceed.   History of Present Illness: Erin Crawford is a  58 y.o.  female with PMH listed below who was referred to me for evaluation of anemia Reviewed patient's recent labs.  Labs revealed anemia with hemoglobin of 9.6, MCV 82, wbc 8.8, platelet  331,000.   Cr 1.81, eGFR 36. Bilirubin <0.2,  Reviewed patient's previous labs ordered by primary care physician's office, anemia is chronic onset , duration is since 2014.  No aggravating or improving factors.  Associated signs and symptoms: Patient reports fatigue. Denies SOB with exertion.  Denies weight loss, easy bruising, hematochezia, hemoptysis, hematuria. Context: History of GI bleeding: denies               History of Chronic kidney disease: stage III CKD               History of autoimmune disease: denies                Feels cold all the time. Fatigue: reports worsening fatigue. Chronic onset, perisistent, no aggravating or improving factors, no associated symptoms.   Location of patient: home Location of provider: work   Erin Crawford is a 58 y.o. female who has above history reviewed by me today presents for follow up visit for management of anemia, Problems and complaints are listed below: During the interval patient has had lab work-up done.  And this telephone visit is for discussion of lab results and management plan for treatment of anemia. Patient reports feeling well.  No new complaints.   Continue to have chronic fatigue. Denies any bleeding events, chest pain, nausea or vomiting.  Review of Systems  Constitutional: Positive for fatigue. Negative for appetite change, chills and fever.  HENT:   Negative for hearing loss and voice change.   Eyes: Negative for eye problems.  Respiratory: Negative for chest tightness and cough.   Cardiovascular: Negative for chest pain.  Gastrointestinal: Negative for abdominal distention, abdominal pain and blood in stool.  Endocrine: Negative for hot flashes.  Genitourinary: Negative for difficulty urinating and frequency.   Musculoskeletal: Negative for arthralgias.  Skin: Negative for itching and rash.  Neurological: Negative for extremity weakness.  Hematological: Negative for adenopathy.  Psychiatric/Behavioral: Negative for confusion.   Past Medical History:  Diagnosis Date  . Diabetes mellitus without complication ([place])   . Hypercholesteremia   . Hypertension   . LGSIL (low grade squamous intraepithelial dysplasia)   . Menopause    Current Outpatient Medications on File Prior to Visit  Medication Sig Dispense Refill  . amLODipine (NORVASC) 10 MG tablet TAKE 1 TABLET BY MOUTH ONCE DAILY    . atorvastatin (LIPITOR) 20 MG tablet Take 20 mg by mouth daily at 6 PM.    . carvedilol (COREG) 6.25 MG tablet Take 6.25 mg by mouth 2 (two) times daily.    . Cholecalciferol (VITAMIN D-1000 MAX ST) 1000 UNITS tablet Take 1 tablet by mouth daily.    . dorzolamide-timolol (COSOPT) 22.3-6.8 MG/ML  ophthalmic solution INSTILL 1 DROP INTO EACH EYE TWICE DAILY    . Dulaglutide 1.5 MG/0.5ML SOPN Inject into the skin. Inject 1.5 mg subcutaneously every 7 days    . fluconazole (DIFLUCAN) 100 MG tablet Take 1 tablet (100 mg total) by mouth daily. 14 tablet 1  . glucose blood (ONE TOUCH ULTRA TEST) test strip Use 2 (two) times daily. Use as instructed.    . hydrALAZINE (APRESOLINE) 25 MG tablet Take 1 tablet by mouth 3 (three) times daily.    .  hydrochlorothiazide (HYDRODIURIL) 25 MG tablet TAKE 1 TABLET BY MOUTH ONCE DAILY    . Insulin Glargine, 2 Unit Dial, 300 UNIT/ML SOPN Inject into the skin.    Marland Kitchen lisinopril (PRINIVIL,ZESTRIL) 40 MG tablet TAKE 1 TABLET BY MOUTH ONCE DAILY    . Multiple Vitamin (MULTI-VITAMINS) TABS Take 1 tablet by mouth daily.    Marland Kitchen ibuprofen (ADVIL,MOTRIN) 800 MG tablet Take 1 tablet (800 mg total) by mouth every 8 (eight) hours as needed. (Patient not taking: Reported on 12/18/2018) 30 tablet 0  . lisinopril-hydrochlorothiazide (PRINZIDE,ZESTORETIC) 20-12.5 MG tablet Take 2 tablets by mouth daily.     No current facility-administered medications on file prior to visit.    Past Surgical History:  Procedure Laterality Date  . COLPOSCOPY    . TUBAL LIGATION      Family History  Problem Relation Age of Onset  . Cancer Mother        colon  . Cancer Father        lung  . Cirrhosis Father   . Diabetes Maternal Grandmother   . Breast cancer Neg Hx    Social History   Socioeconomic History  . Marital status: Married    Spouse name: Not on file  . Number of children: Not on file  . Years of education: Not on file  . Highest education level: Not on file  Occupational History  . Not on file  Social Needs  . Financial resource strain: Not on file  . Food insecurity:    Worry: Not on file    Inability: Not on file  . Transportation needs:    Medical: Not on file    Non-medical: Not on file  Tobacco Use  . Smoking status: Never Smoker  . Smokeless tobacco: Never Used  Substance and Sexual Activity  . Alcohol use: No  . Drug use: No  . Sexual activity: Yes  Lifestyle  . Physical activity:    Days per week: Not on file    Minutes per session: Not on file  . Stress: Not on file  Relationships  . Social connections:    Talks on phone: Not on file    Gets together: Not on file    Attends religious service: Not on file    Active member of club or organization: Not on file    Attends meetings  of clubs or organizations: Not on file    Relationship status: Not on file  . Intimate partner violence:    Fear of current or ex partner: Not on file    Emotionally abused: Not on file    Physically abused: Not on file    Forced sexual activity: Not on file  Other Topics Concern  . Not on file  Social History Narrative  . Not on file   No Known Allergies   Patient has labs done via [facility] and results were scanned to epic's.  Observations/Objective: No vital signs were obtained due to telephone  visit.  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 Multiple myeloma panel not remarkable.  Increased free light chain ratio likely secondary to CKD. Normal vitamin B-12 and folate level. I recommend patient to proceed with IV Venofer treatments to further improve her iron stores.  If hemoglobin less than 10 after iron is repleted, will proceed with erythropoietin treatments. Plan IV iron with Venofer 241m weekly x 4 doses. Allergy reactions/infusion reaction including anaphylactic reaction discussed with patient. Other side effects include but not limited to high blood pressure, skin rash, weight gain, leg swelling, etc. Patient voices understanding and willing to proceed.   Follow Up Instructions: Proceed with IV Venofer weekly x2. Repeat labs and MD assessment possible Procrit treatment in 4 weeks.   I discussed the assessment and treatment plan with the patient. The patient was provided an opportunity to ask questions and all were answered. The patient agreed with the plan and demonstrated an understanding of the instructions.   The patient was advised to call back or seek an in-person evaluation if the symptoms worsen or if the condition fails to improve as anticipated.  I provided 18 minutes of non-face-to-face time during this encounter.   ZEarlie Server MD 12/18/18

## 2018-12-18 NOTE — Progress Notes (Signed)
Patient visit conducted via phone. Per patient no medication changes and no new concerns voiced.

## 2018-12-19 ENCOUNTER — Inpatient Hospital Stay: Payer: Managed Care, Other (non HMO) | Admitting: Oncology

## 2018-12-23 ENCOUNTER — Ambulatory Visit: Payer: Managed Care, Other (non HMO)

## 2018-12-23 ENCOUNTER — Other Ambulatory Visit: Payer: Self-pay | Admitting: Oncology

## 2018-12-23 DIAGNOSIS — D631 Anemia in chronic kidney disease: Secondary | ICD-10-CM | POA: Insufficient documentation

## 2018-12-23 DIAGNOSIS — N189 Chronic kidney disease, unspecified: Principal | ICD-10-CM

## 2018-12-27 ENCOUNTER — Inpatient Hospital Stay: Payer: Managed Care, Other (non HMO) | Attending: Oncology

## 2018-12-27 ENCOUNTER — Other Ambulatory Visit: Payer: Self-pay

## 2018-12-27 VITALS — BP 160/76 | HR 88 | Temp 96.7°F | Resp 18

## 2018-12-27 DIAGNOSIS — D649 Anemia, unspecified: Secondary | ICD-10-CM | POA: Diagnosis present

## 2018-12-27 DIAGNOSIS — N189 Chronic kidney disease, unspecified: Secondary | ICD-10-CM

## 2018-12-27 DIAGNOSIS — D631 Anemia in chronic kidney disease: Secondary | ICD-10-CM

## 2018-12-27 MED ORDER — SODIUM CHLORIDE 0.9 % IV SOLN
Freq: Once | INTRAVENOUS | Status: AC
  Administered 2018-12-27: 14:00:00 via INTRAVENOUS
  Filled 2018-12-27: qty 250

## 2018-12-27 MED ORDER — IRON SUCROSE 20 MG/ML IV SOLN
200.0000 mg | Freq: Once | INTRAVENOUS | Status: AC
  Administered 2018-12-27: 200 mg via INTRAVENOUS
  Filled 2018-12-27: qty 10

## 2019-01-03 ENCOUNTER — Inpatient Hospital Stay: Payer: Managed Care, Other (non HMO)

## 2019-01-03 ENCOUNTER — Other Ambulatory Visit: Payer: Self-pay

## 2019-01-03 VITALS — BP 163/81 | HR 90 | Resp 20

## 2019-01-03 DIAGNOSIS — N189 Chronic kidney disease, unspecified: Principal | ICD-10-CM

## 2019-01-03 DIAGNOSIS — D649 Anemia, unspecified: Secondary | ICD-10-CM | POA: Diagnosis not present

## 2019-01-03 DIAGNOSIS — D631 Anemia in chronic kidney disease: Secondary | ICD-10-CM

## 2019-01-03 MED ORDER — SODIUM CHLORIDE 0.9 % IV SOLN
Freq: Once | INTRAVENOUS | Status: AC
  Administered 2019-01-03: 14:00:00 via INTRAVENOUS
  Filled 2019-01-03: qty 250

## 2019-01-03 MED ORDER — IRON SUCROSE 20 MG/ML IV SOLN
200.0000 mg | Freq: Once | INTRAVENOUS | Status: AC
  Administered 2019-01-03: 200 mg via INTRAVENOUS
  Filled 2019-01-03: qty 10

## 2019-01-09 ENCOUNTER — Other Ambulatory Visit: Payer: Self-pay | Admitting: Oncology

## 2019-01-13 ENCOUNTER — Other Ambulatory Visit: Payer: Self-pay | Admitting: Oncology

## 2019-01-14 ENCOUNTER — Other Ambulatory Visit: Payer: Self-pay

## 2019-01-15 ENCOUNTER — Inpatient Hospital Stay: Payer: Managed Care, Other (non HMO) | Admitting: Oncology

## 2019-01-15 ENCOUNTER — Inpatient Hospital Stay: Payer: Managed Care, Other (non HMO)

## 2019-01-17 ENCOUNTER — Encounter: Payer: Self-pay | Admitting: Oncology

## 2019-01-17 ENCOUNTER — Other Ambulatory Visit: Payer: Self-pay

## 2019-01-20 ENCOUNTER — Other Ambulatory Visit: Payer: Self-pay

## 2019-01-20 ENCOUNTER — Inpatient Hospital Stay (HOSPITAL_BASED_OUTPATIENT_CLINIC_OR_DEPARTMENT_OTHER): Payer: Managed Care, Other (non HMO) | Admitting: Oncology

## 2019-01-20 ENCOUNTER — Encounter: Payer: Self-pay | Admitting: Oncology

## 2019-01-20 DIAGNOSIS — D631 Anemia in chronic kidney disease: Secondary | ICD-10-CM

## 2019-01-20 DIAGNOSIS — E119 Type 2 diabetes mellitus without complications: Secondary | ICD-10-CM | POA: Diagnosis not present

## 2019-01-20 DIAGNOSIS — Z79899 Other long term (current) drug therapy: Secondary | ICD-10-CM

## 2019-01-20 DIAGNOSIS — D509 Iron deficiency anemia, unspecified: Secondary | ICD-10-CM

## 2019-01-20 DIAGNOSIS — N189 Chronic kidney disease, unspecified: Secondary | ICD-10-CM | POA: Diagnosis not present

## 2019-01-20 DIAGNOSIS — I10 Essential (primary) hypertension: Secondary | ICD-10-CM

## 2019-01-20 DIAGNOSIS — Z800 Family history of malignant neoplasm of digestive organs: Secondary | ICD-10-CM

## 2019-01-20 NOTE — Progress Notes (Signed)
Called patient for Televisit with [facility].  Patient states no new concerns today.   Patient states no SOB or Fatigue.

## 2019-01-20 NOTE — Progress Notes (Signed)
Erin Crawford HEMATOLOGY-ONCOLOGY TeleHEALTH VISIT PROGRESS NOTE  I connected with Erin Crawford on 01/20/19 at  8:30 AM EDT by video enabled telemedicine visit and verified that I am speaking with the correct person using two identifiers. I discussed the limitations, risks, security and privacy concerns of performing an evaluation and management service by telemedicine and the availability of in-person appointments. I also discussed with the patient that there may be a patient responsible charge related to this service. The patient expressed understanding and agreed to proceed.   Other persons participating in the visit and their role in the encounter:  Geraldine Solar, [place], check in patient   Janeann Merl, RN, check in patient.   Patient's location: Home  Provider's location: Home Chief Complaint: Follow-up for management of anemia.   INTERVAL HISTORY Erin Crawford is a 58 y.o. female who has above history reviewed by me today presents for follow up visit for management of anemia. Problems and complaints are listed below:  Patient has chronic anemia and had lab work-up during last visit.  Felt to have a combination of iron deficiency and anemia secondary to chronic kidney disease.  Patient received 2 weekly IV Venofer during the interval. Clinically reports feeling a lot better.  A lot less fatigue.  No new complaints.  Denies any pain today.  Review of Systems  Constitutional: Negative for appetite change, chills, fatigue and fever.  HENT:   Negative for hearing loss and voice change.   Eyes: Negative for eye problems.  Respiratory: Negative for chest tightness and cough.   Cardiovascular: Negative for chest pain.  Gastrointestinal: Negative for abdominal distention, abdominal pain and blood in stool.  Endocrine: Negative for hot flashes.  Genitourinary: Negative for difficulty urinating and frequency.   Musculoskeletal: Negative for arthralgias.  Skin: Negative for itching and rash.   Neurological: Negative for extremity weakness.  Hematological: Negative for adenopathy.  Psychiatric/Behavioral: Negative for confusion.    Past Medical History:  Diagnosis Date  . Diabetes mellitus without complication ([place])   . Hypercholesteremia   . Hypertension   . LGSIL (low grade squamous intraepithelial dysplasia)   . Menopause    Past Surgical History:  Procedure Laterality Date  . COLPOSCOPY    . TUBAL LIGATION      Family History  Problem Relation Age of Onset  . Cancer Mother        colon  . Cancer Father        lung  . Cirrhosis Father   . Diabetes Maternal Grandmother   . Breast cancer Neg Hx     Social History   Socioeconomic History  . Marital status: Married    Spouse name: Not on file  . Number of children: Not on file  . Years of education: Not on file  . Highest education level: Not on file  Occupational History  . Not on file  Social Needs  . Financial resource strain: Not on file  . Food insecurity:    Worry: Not on file    Inability: Not on file  . Transportation needs:    Medical: Not on file    Non-medical: Not on file  Tobacco Use  . Smoking status: Never Smoker  . Smokeless tobacco: Never Used  Substance and Sexual Activity  . Alcohol use: No  . Drug use: No  . Sexual activity: Yes  Lifestyle  . Physical activity:    Days per week: Not on file    Minutes per session: Not on file  .  Stress: Not on file  Relationships  . Social connections:    Talks on phone: Not on file    Gets together: Not on file    Attends religious service: Not on file    Active member of club or organization: Not on file    Attends meetings of clubs or organizations: Not on file    Relationship status: Not on file  . Intimate partner violence:    Fear of current or ex partner: Not on file    Emotionally abused: Not on file    Physically abused: Not on file    Forced sexual activity: Not on file  Other Topics Concern  . Not on file  Social History  Narrative  . Not on file    Current Outpatient Medications on File Prior to Visit  Medication Sig Dispense Refill  . amLODipine (NORVASC) 10 MG tablet TAKE 1 TABLET BY MOUTH ONCE DAILY    . Ascorbic Acid 500 MG CAPS Take by mouth.    Erin Crawford atorvastatin (LIPITOR) 20 MG tablet Take 20 mg by mouth daily at 6 PM.    . carvedilol (COREG) 6.25 MG tablet Take 6.25 mg by mouth 2 (two) times daily.    . Cholecalciferol (VITAMIN D-1000 MAX ST) 1000 UNITS tablet Take 1 tablet by mouth daily.    . dorzolamide-timolol (COSOPT) 22.3-6.8 MG/ML ophthalmic solution INSTILL 1 DROP INTO EACH EYE TWICE DAILY    . Dulaglutide 1.5 MG/0.5ML SOPN Inject into the skin. Inject 1.5 mg subcutaneously every 7 days    . ELDERBERRY PO Take by mouth.    Erin Crawford glucose blood (ONE TOUCH ULTRA TEST) test strip Use 2 (two) times daily. Use as instructed.    . hydrALAZINE (APRESOLINE) 25 MG tablet Take 1 tablet by mouth 3 (three) times daily.    . hydrochlorothiazide (HYDRODIURIL) 25 MG tablet TAKE 1 TABLET BY MOUTH ONCE DAILY    . ibuprofen (ADVIL,MOTRIN) 800 MG tablet Take 1 tablet (800 mg total) by mouth every 8 (eight) hours as needed. 30 tablet 0  . Insulin Glargine, 2 Unit Dial, 300 UNIT/ML SOPN Inject into the skin.    Erin Crawford lisinopril (PRINIVIL,ZESTRIL) 40 MG tablet TAKE 1 TABLET BY MOUTH ONCE DAILY    . Multiple Vitamin (MULTI-VITAMINS) TABS Take 1 tablet by mouth daily.    Erin Crawford lisinopril-hydrochlorothiazide (PRINZIDE,ZESTORETIC) 20-12.5 MG tablet Take 2 tablets by mouth daily.     No current facility-administered medications on file prior to visit.     No Known Allergies     Observations/Objective: There were no vitals filed for this visit. There is no height or weight on file to calculate BMI.  Pain level 0 Physical Exam  Constitutional: She is oriented to person, place, and time and well-developed, well-nourished, and in no distress. No distress.  HENT:  Head: Normocephalic.  Neck: Normal range of motion.   Pulmonary/Chest: Effort normal. No respiratory distress.  Neurological: She is alert and oriented to person, place, and time.  Psychiatric: Affect normal.    CBC    Component Value Date/Time   WBC 7.3 02/08/2015 2158   RBC 4.10 02/08/2015 2158   HGB 11.0 (L) 02/08/2015 2158   HGB 11.6 (L) 01/09/2014 1333   HCT 33.5 (L) 02/08/2015 2158   HCT 35.2 01/09/2014 1333   PLT 277 02/08/2015 2158   PLT 275 01/09/2014 1333   MCV 81.7 02/08/2015 2158   MCV 84 01/09/2014 1333   MCH 26.9 02/08/2015 2158   MCHC 32.9 02/08/2015 2158  RDW 14.4 02/08/2015 2158   RDW 14.5 01/09/2014 1333   LYMPHSABS 2.1 02/08/2015 2158   LYMPHSABS 1.2 01/09/2014 1333   MONOABS 0.5 02/08/2015 2158   MONOABS 0.8 01/09/2014 1333   EOSABS 0.2 02/08/2015 2158   EOSABS 0.0 01/09/2014 1333   BASOSABS 0.0 02/08/2015 2158   BASOSABS 0.1 01/09/2014 1333    CMP     Component Value Date/Time   NA 140 08/17/2015 1030   NA 142 01/09/2014 1333   K 3.9 08/17/2015 1030   K 3.5 01/09/2014 1333   CL 99 08/17/2015 1030   CL 107 01/09/2014 1333   CO2 26 08/17/2015 1030   CO2 28 01/09/2014 1333   GLUCOSE 199 (H) 08/17/2015 1030   GLUCOSE 307 (H) 02/08/2015 2158   GLUCOSE 155 (H) 01/09/2014 1333   BUN 20 08/17/2015 1030   BUN 10 01/09/2014 1333   CREATININE 0.94 08/17/2015 1030   CREATININE 1.13 01/09/2014 1333   CALCIUM 9.8 08/17/2015 1030   CALCIUM 8.3 (L) 01/09/2014 1333   PROT 7.1 08/17/2015 1030   PROT 7.1 01/09/2014 1333   ALBUMIN 4.1 08/17/2015 1030   ALBUMIN 3.0 (L) 01/09/2014 1333   AST 13 08/17/2015 1030   AST 25 01/09/2014 1333   ALT 9 08/17/2015 1030   ALT 15 01/09/2014 1333   ALKPHOS 94 08/17/2015 1030   ALKPHOS 82 01/09/2014 1333   BILITOT <0.2 08/17/2015 1030   BILITOT 0.3 01/09/2014 1333   GFRNONAA 69 08/17/2015 1030   GFRNONAA 56 (L) 01/09/2014 1333   GFRAA 80 08/17/2015 1030   GFRAA >60 01/09/2014 1333     Assessment and Plan: 1. Anemia of chronic kidney failure, unspecified stage    2. Family history of colon cancer in mother   48. Iron deficiency anemia, unspecified iron deficiency anemia type     Anemia of chronic kidney disease, status post 2 IV Venofer treatments. Labs from [facility] were reviewed.  01/16/2019, she has hemoglobin 8.6, WBC 7.4, platelet 288, MCV 87.  TIBC 14, ferritin 371.   For now I will hold additional IV iron and recommend patient to start with erythropoietin replacement treatments. The rationale and potential side effects of erythropoietin replacement discussed in details with patient. Erythropoietin replacement with Procrit can potentially increase risk of stroke, MI, thrombosis, progression of underlying malignancy.  Patient voices understanding and willing to proceed with treatments.  She will start 1 treatment of Procrit 40,000 units this week, H&H every 4 weeks. Proceed with Procrit 40,000 units.  Holding parameters include SBP above 175, DBP above 110, or hemoglobin above 10.  If she has hemoglobin increase more than 1 g within 4 weeks, will reduce Procrit dose to 25%. Family history of colon cancer.  Patient will need to follow-up with gastroenterology for appropriate screening. Follow Up Instructions: Follow-up in 12 weeks with lab and MD assessment+/- Procrit.   I discussed the assessment and treatment plan with the patient. The patient was provided an opportunity to ask questions and all were answered. The patient agreed with the plan and demonstrated an understanding of the instructions.  The patient was advised to call back or seek an in-person evaluation if the symptoms worsen or if the condition fails to improve as anticipated.    Earlie Server, MD 01/20/2019 12:08 PM

## 2019-01-22 ENCOUNTER — Other Ambulatory Visit: Payer: Self-pay

## 2019-01-22 ENCOUNTER — Inpatient Hospital Stay: Payer: Managed Care, Other (non HMO)

## 2019-01-22 VITALS — BP 158/73 | HR 74

## 2019-01-22 DIAGNOSIS — D631 Anemia in chronic kidney disease: Secondary | ICD-10-CM

## 2019-01-22 DIAGNOSIS — N189 Chronic kidney disease, unspecified: Principal | ICD-10-CM

## 2019-01-22 DIAGNOSIS — D649 Anemia, unspecified: Secondary | ICD-10-CM | POA: Diagnosis not present

## 2019-01-22 MED ORDER — EPOETIN ALFA 40000 UNIT/ML IJ SOLN
40000.0000 [IU] | Freq: Once | INTRAMUSCULAR | Status: AC
  Administered 2019-01-22: 40000 [IU] via SUBCUTANEOUS

## 2019-02-03 ENCOUNTER — Ambulatory Visit: Payer: Managed Care, Other (non HMO)

## 2019-02-12 ENCOUNTER — Encounter: Payer: Self-pay | Admitting: Oncology

## 2019-02-14 ENCOUNTER — Encounter: Payer: Self-pay | Admitting: Oncology

## 2019-02-14 ENCOUNTER — Other Ambulatory Visit: Payer: Self-pay | Admitting: Oncology

## 2019-02-18 ENCOUNTER — Other Ambulatory Visit: Payer: Self-pay

## 2019-02-19 ENCOUNTER — Inpatient Hospital Stay: Payer: Managed Care, Other (non HMO) | Attending: Oncology

## 2019-02-19 ENCOUNTER — Other Ambulatory Visit: Payer: Self-pay

## 2019-02-19 VITALS — BP 157/79 | HR 73

## 2019-02-19 DIAGNOSIS — D631 Anemia in chronic kidney disease: Secondary | ICD-10-CM | POA: Diagnosis present

## 2019-02-19 DIAGNOSIS — N189 Chronic kidney disease, unspecified: Secondary | ICD-10-CM | POA: Insufficient documentation

## 2019-02-19 MED ORDER — EPOETIN ALFA 40000 UNIT/ML IJ SOLN: 30000.0000 [IU] | Freq: Once | INTRAMUSCULAR | Status: DC

## 2019-02-19 MED ORDER — EPOETIN ALFA 10000 UNIT/ML IJ SOLN
10000.0000 [IU] | Freq: Once | INTRAMUSCULAR | Status: AC
  Administered 2019-02-19: 12:00:00 10000 [IU] via SUBCUTANEOUS

## 2019-02-19 MED ORDER — EPOETIN ALFA 20000 UNIT/ML IJ SOLN
20000.0000 [IU] | Freq: Once | INTRAMUSCULAR | Status: AC
  Administered 2019-02-19: 12:00:00 20000 [IU] via SUBCUTANEOUS

## 2019-03-19 ENCOUNTER — Other Ambulatory Visit: Payer: Self-pay

## 2019-03-19 ENCOUNTER — Inpatient Hospital Stay: Payer: Managed Care, Other (non HMO)

## 2019-03-20 ENCOUNTER — Other Ambulatory Visit: Payer: Self-pay

## 2019-03-20 ENCOUNTER — Encounter: Payer: Self-pay | Admitting: Oncology

## 2019-03-21 ENCOUNTER — Inpatient Hospital Stay: Payer: Managed Care, Other (non HMO) | Attending: Oncology

## 2019-03-21 ENCOUNTER — Other Ambulatory Visit: Payer: Self-pay

## 2019-03-21 VITALS — BP 157/83 | HR 67

## 2019-03-21 DIAGNOSIS — N189 Chronic kidney disease, unspecified: Secondary | ICD-10-CM | POA: Diagnosis not present

## 2019-03-21 DIAGNOSIS — D631 Anemia in chronic kidney disease: Secondary | ICD-10-CM | POA: Diagnosis present

## 2019-03-21 MED ORDER — EPOETIN ALFA 20000 UNIT/ML IJ SOLN
20000.0000 [IU] | Freq: Once | INTRAMUSCULAR | Status: AC
  Administered 2019-03-21: 11:00:00 20000 [IU] via SUBCUTANEOUS

## 2019-03-21 MED ORDER — EPOETIN ALFA 10000 UNIT/ML IJ SOLN
10000.0000 [IU] | Freq: Once | INTRAMUSCULAR | Status: AC
  Administered 2019-03-21: 10000 [IU] via SUBCUTANEOUS

## 2019-03-21 MED ORDER — EPOETIN ALFA 40000 UNIT/ML IJ SOLN: 30000.0000 [IU] | Freq: Once | INTRAMUSCULAR | Status: DC

## 2019-04-16 ENCOUNTER — Inpatient Hospital Stay: Payer: Managed Care, Other (non HMO) | Attending: Oncology | Admitting: Oncology

## 2019-04-16 ENCOUNTER — Encounter: Payer: Self-pay | Admitting: Oncology

## 2019-04-16 ENCOUNTER — Inpatient Hospital Stay: Payer: Managed Care, Other (non HMO)

## 2019-04-16 ENCOUNTER — Other Ambulatory Visit: Payer: Self-pay

## 2019-04-16 VITALS — BP 171/83 | HR 64 | Temp 97.6°F | Resp 18 | Wt 200.1 lb

## 2019-04-16 DIAGNOSIS — N184 Chronic kidney disease, stage 4 (severe): Secondary | ICD-10-CM | POA: Diagnosis not present

## 2019-04-16 DIAGNOSIS — D509 Iron deficiency anemia, unspecified: Secondary | ICD-10-CM | POA: Diagnosis not present

## 2019-04-16 DIAGNOSIS — Z800 Family history of malignant neoplasm of digestive organs: Secondary | ICD-10-CM | POA: Diagnosis not present

## 2019-04-16 DIAGNOSIS — D631 Anemia in chronic kidney disease: Secondary | ICD-10-CM | POA: Diagnosis not present

## 2019-04-16 NOTE — Progress Notes (Signed)
Pt in for follow up, denies any difficulties or concerns.  

## 2019-04-17 NOTE — Progress Notes (Addendum)
Hematology/Oncology follow up [facility] [phone] [phone]   Patient Care Team: [name], NP as PCP - General (Internal Medicine)  REFERRING PROVIDER: Dr.Lateef CHIEF COMPLAINTS/REASON FOR VISIT:  Evaluation of anemia  HISTORY OF PRESENTING ILLNESS:  Erin Crawford is a  58 y.o.  female with PMH listed below who was referred to me for evaluation of anemia Reviewed patient's recent labs.  Labs revealed anemia with hemoglobin of 9.6, MCV 82, wbc 8.8, platelet  331,000.   Cr 1.81, eGFR 36. Bilirubin <0.2,  Reviewed patient's previous labs ordered by primary care physician's office, anemia is chronic onset , duration is since 2014.  No aggravating or improving factors.  Associated signs and symptoms: Patient reports fatigue. Denies SOB with exertion.  Denies weight loss, easy bruising, hematochezia, hemoptysis, hematuria. Context: History of GI bleeding: denies               History of Chronic kidney disease: stage III CKD               History of autoimmune disease: denies                Feels cold all the time. Fatigue: reports worsening fatigue. Chronic onset, perisistent, no aggravating or improving factors, no associated symptoms.    INTERVAL HISTORY [name] is a 58 y.o. female who has above history reviewed by me today presents for follow up visit for management of anemia of chronic kidney disease.  Problems and complaints are listed below: Patient reports feeling well. She has been on erythropoietin therapy monthly if hemoglobin less than 10. Reports energy level has improved as well. No new complaints today .  Denies any chest pain, lower extremity swelling, abdominal pain. hronic fatigue. Denies any bleeding events, chest pain, nausea or vomiting  Review of Systems  Constitutional: Positive for fatigue. Negative for appetite change, chills and fever.  HENT:   Negative for hearing loss and voice change.    Eyes: Negative for eye problems.  Respiratory: Negative for chest tightness and cough.   Cardiovascular: Negative for chest pain.  Gastrointestinal: Negative for abdominal distention, abdominal pain and blood in stool.  Endocrine: Negative for hot flashes.  Genitourinary: Negative for difficulty urinating and frequency.   Musculoskeletal: Negative for arthralgias.  Skin: Negative for itching and rash.  Neurological: Negative for extremity weakness.  Hematological: Negative for adenopathy.  Psychiatric/Behavioral: Negative for confusion.    MEDICAL HISTORY:  Past Medical History:  Diagnosis Date  . Diabetes mellitus without complication ([place])   . Hypercholesteremia   . Hypertension   . LGSIL (low grade squamous intraepithelial dysplasia)   . Menopause     SURGICAL HISTORY: Past Surgical History:  Procedure Laterality Date  . COLPOSCOPY    . TUBAL LIGATION      SOCIAL HISTORY: Social History   Socioeconomic History  . Marital status: Married    Spouse name: Not on file  . Number of children: Not on file  . Years of education: Not on file  . Highest education level: Not on file  Occupational History  . Not on file  Social Needs  . Financial resource strain: Not on file  . Food insecurity    Worry: Not on file    Inability: Not on file  . Transportation needs    Medical: Not on file    Non-medical: Not on file  Tobacco Use  . Smoking status: Never Smoker  . Smokeless tobacco: Never Used  Substance and Sexual Activity  . Alcohol use: No  . Drug use: No  . Sexual activity: Yes  Lifestyle  . Physical activity    Days per week: Not on file    Minutes per session: Not on file  . Stress: Not on file  Relationships  . Social Herbalist on phone: Not on file    Gets together: Not on file    Attends religious service: Not on file    Active member of club or organization: Not on file    Attends meetings of clubs or organizations: Not on file     Relationship status: Not on file  . Intimate partner violence    Fear of current or ex partner: Not on file    Emotionally abused: Not on file    Physically abused: Not on file    Forced sexual activity: Not on file  Other Topics Concern  . Not on file  Social History Narrative  . Not on file    FAMILY HISTORY: Family History  Problem Relation Age of Onset  . Cancer Mother        colon  . Cancer Father        lung  . Cirrhosis Father   . Diabetes Maternal Grandmother   . Breast cancer Neg Hx     ALLERGIES:  has No Known Allergies.  MEDICATIONS:  Current Outpatient Medications  Medication Sig Dispense Refill  . amLODipine (NORVASC) 10 MG tablet TAKE 1 TABLET BY MOUTH ONCE DAILY    . Ascorbic Acid 500 MG CAPS Take by mouth.    Marland Kitchen atorvastatin (LIPITOR) 20 MG tablet Take 20 mg by mouth daily at 6 PM.    . carvedilol (COREG) 6.25 MG tablet Take 6.25 mg by mouth 2 (two) times daily.    . Cholecalciferol (VITAMIN D-1000 MAX ST) 1000 UNITS tablet Take 1 tablet by mouth daily.    . dorzolamide-timolol (COSOPT) 22.3-6.8 MG/ML ophthalmic solution INSTILL 1 DROP INTO EACH EYE TWICE DAILY    . Dulaglutide 1.5 MG/0.5ML SOPN Inject into the skin. Inject 1.5 mg subcutaneously every 7 days    . ELDERBERRY PO Take by mouth.    Marland Kitchen glucose blood (ONE TOUCH ULTRA TEST) test strip Use 2 (two) times daily. Use as instructed.    . hydrALAZINE (APRESOLINE) 25 MG tablet Take 1 tablet by mouth 3 (three) times daily.    . hydrochlorothiazide (HYDRODIURIL) 25 MG tablet TAKE 1 TABLET BY MOUTH ONCE DAILY    . ibuprofen (ADVIL,MOTRIN) 800 MG tablet Take 1 tablet (800 mg total) by mouth every 8 (eight) hours as needed. 30 tablet 0  . Insulin Glargine, 2 Unit Dial, 300 UNIT/ML SOPN Inject into the skin.    Marland Kitchen lisinopril (PRINIVIL,ZESTRIL) 40 MG tablet TAKE 1 TABLET BY MOUTH ONCE DAILY    . Multiple Vitamin (MULTI-VITAMINS) TABS Take 1 tablet by mouth daily.    . TOUJEO MAX SOLOSTAR 300 UNIT/ML SOPN     .  TRULICITY 1.5 GY/6.9SW SOPN      No current facility-administered medications for this visit.      PHYSICAL EXAMINATION: ECOG PERFORMANCE STATUS: 1 - Symptomatic but completely ambulatory Vitals:   04/16/19 1032  BP: (!) 171/83  Pulse: 64  Resp: 18  Temp: 97.6 F (36.4 C)   Filed Weights   04/16/19 1032  Weight: 200 lb 1.6 oz (90.8 kg)    Physical Exam Constitutional:      General: She is not in  acute distress.    Appearance: She is obese.  HENT:     Head: Normocephalic and atraumatic.  Eyes:     General: No scleral icterus.    Pupils: Pupils are equal, round, and reactive to light.  Neck:     Musculoskeletal: Normal range of motion and neck supple.  Cardiovascular:     Rate and Rhythm: Normal rate and regular rhythm.     Heart sounds: Normal heart sounds.  Pulmonary:     Effort: Pulmonary effort is normal. No respiratory distress.     Breath sounds: No wheezing.  Abdominal:     General: Bowel sounds are normal. There is no distension.     Palpations: Abdomen is soft. There is no mass.     Tenderness: There is no abdominal tenderness.  Musculoskeletal: Normal range of motion.        General: No deformity.  Skin:    General: Skin is warm and dry.     Findings: No erythema or rash.  Neurological:     Mental Status: She is alert and oriented to person, place, and time.     Cranial Nerves: No cranial nerve deficit.     Coordination: Coordination normal.  Psychiatric:        Behavior: Behavior normal.        Thought Content: Thought content normal.      LABORATORY DATA:  I have reviewed the data as listed Lab Results  Component Value Date   WBC 7.3 02/08/2015   HGB 11.0 (L) 02/08/2015   HCT 33.5 (L) 02/08/2015   MCV 81.7 02/08/2015   PLT 277 02/08/2015   No results for input(s): NA, K, CL, CO2, GLUCOSE, BUN, CREATININE, CALCIUM, GFRNONAA, GFRAA, PROT, ALBUMIN, AST, ALT, ALKPHOS, BILITOT, BILIDIR, IBILI in the last 8760 hours. Iron/TIBC/Ferritin/ %Sat  No results found for: IRON, TIBC, FERRITIN, IRONPCTSAT   [facility] labs  01/16/2019, she has hemoglobin 8.6, WBC 7.4, platelet 288, MCV 87.  TIBC 14, ferritin 371.   04/15/2019 CBC showed WBC 8.9, hemoglobin 10.3, hematocrit 32.1.  MCV 84, platelet 322 Ferritin 232, iron saturation 17 Creatinine 2.5  RADIOGRAPHIC STUDIES: I have personally reviewed the radiological images as listed and agreed with the findings in the report. 12/17/2017 screening mammogram bilateral:  No mammographic findings suspicious for malignancy.   ASSESSMENT & PLAN:  1. Anemia due to stage 4 chronic kidney disease ([place])   2. Family history of colon cancer in mother   71. Iron deficiency anemia, unspecified iron deficiency anemia type    #[facility] labs are reviewed and discussed with patient. Hemoglobin 10.3, hold Procrit treatments. Continue lab work CBC monthly, and  Procrit, hold procrit if Hb>10  # family history of colon cancer, Cancer appropriate screening: recommend colonoscopy. She is also due to annual mammogram discussed with patient..  Her anemia was felt to be a combination of iron deficiency and anemia due to CKD. Recommend patient to discuss with PCP and start colonoscopy screening.  All questions were answered. The patient knows to call the clinic with any problems questions or concerns.  cc PCP Return of visit: 3 months.   Earlie Server, MD, PhD Hematology Oncology [place] at [facility] 04/17/2019

## 2019-05-13 ENCOUNTER — Other Ambulatory Visit: Payer: Self-pay

## 2019-05-13 ENCOUNTER — Ambulatory Visit
Admission: RE | Admit: 2019-05-13 | Discharge: 2019-05-13 | Disposition: A | Discharge status: Home / Self Care | Payer: Managed Care, Other (non HMO) | Source: Ambulatory Visit | Attending: Nurse Practitioner | Admitting: Nurse Practitioner

## 2019-05-13 DIAGNOSIS — Z1231 Encounter for screening mammogram for malignant neoplasm of breast: Secondary | ICD-10-CM | POA: Diagnosis not present

## 2019-05-14 ENCOUNTER — Inpatient Hospital Stay: Payer: Managed Care, Other (non HMO) | Attending: Oncology

## 2019-05-14 ENCOUNTER — Other Ambulatory Visit: Payer: Self-pay

## 2019-05-15 ENCOUNTER — Encounter: Payer: Self-pay | Admitting: Oncology

## 2019-06-10 ENCOUNTER — Other Ambulatory Visit: Payer: Self-pay

## 2019-06-11 ENCOUNTER — Inpatient Hospital Stay: Payer: Managed Care, Other (non HMO)

## 2019-06-12 ENCOUNTER — Encounter: Payer: Self-pay | Admitting: Oncology

## 2019-06-12 ENCOUNTER — Other Ambulatory Visit: Payer: Self-pay

## 2019-06-13 ENCOUNTER — Inpatient Hospital Stay: Payer: Managed Care, Other (non HMO) | Attending: Oncology

## 2019-06-13 ENCOUNTER — Other Ambulatory Visit: Payer: Self-pay

## 2019-06-13 NOTE — Progress Notes (Signed)
Patient came in for Procrit injection BP was 193/91 then 182/81. Patient advised to go to ER but if she didn't want to then she could contact her PCP. Patient states she has been having BP issues and that she has had little blurry vision. Advised ER again or Contact her PCP. Patient will be contacting her PCP immediately

## 2019-07-09 ENCOUNTER — Encounter: Payer: Self-pay | Admitting: Oncology

## 2019-07-09 ENCOUNTER — Other Ambulatory Visit: Payer: Self-pay

## 2019-07-09 ENCOUNTER — Inpatient Hospital Stay: Payer: Managed Care, Other (non HMO)

## 2019-07-09 ENCOUNTER — Telehealth: Payer: Self-pay

## 2019-07-09 ENCOUNTER — Inpatient Hospital Stay: Payer: Managed Care, Other (non HMO) | Attending: Oncology | Admitting: Oncology

## 2019-07-09 VITALS — BP 172/81 | HR 72 | Temp 98.2°F | Resp 16 | Wt 197.5 lb

## 2019-07-09 DIAGNOSIS — D631 Anemia in chronic kidney disease: Secondary | ICD-10-CM | POA: Diagnosis present

## 2019-07-09 DIAGNOSIS — N184 Chronic kidney disease, stage 4 (severe): Secondary | ICD-10-CM | POA: Insufficient documentation

## 2019-07-09 DIAGNOSIS — D509 Iron deficiency anemia, unspecified: Secondary | ICD-10-CM | POA: Diagnosis not present

## 2019-07-09 DIAGNOSIS — Z800 Family history of malignant neoplasm of digestive organs: Secondary | ICD-10-CM | POA: Diagnosis not present

## 2019-07-09 NOTE — Progress Notes (Signed)
Patient does not offer any problems today.  

## 2019-07-09 NOTE — Telephone Encounter (Signed)
Contacted [facility] to get preliminary results. Per lab tech.   Hgb 9.1 RBC: 3.37 Hct 28.9 RDW 16.1  Values read back and verified with tech.

## 2019-07-09 NOTE — Progress Notes (Signed)
Hematology/Oncology follow up [facility] [phone] [phone]   Patient Care Team: Gauger, Sarah Kathryn, NP as PCP - General (Internal Medicine)  REFERRING PROVIDER: Dr.Lateef CHIEF COMPLAINTS/REASON FOR VISIT:  Evaluation of anemia  HISTORY OF PRESENTING ILLNESS:  Erin Crawford is a  57 y.o.  female with PMH listed below who was referred to me for evaluation of anemia Reviewed patient's recent labs.  Labs revealed anemia with hemoglobin of 9.6, MCV 82, wbc 8.8, platelet  331,000.   Cr 1.81, eGFR 36. Bilirubin <0.2,  Reviewed patient's previous labs ordered by primary care physician's office, anemia is chronic onset , duration is since 2014.  No aggravating or improving factors.  Associated signs and symptoms: Patient reports fatigue. Denies SOB with exertion.  Denies weight loss, easy bruising, hematochezia, hemoptysis, hematuria. Context: History of GI bleeding: denies               History of Chronic kidney disease: stage III CKD               History of autoimmune disease: denies                Feels cold all the time. Fatigue: reports worsening fatigue. Chronic onset, perisistent, no aggravating or improving factors, no associated symptoms.    INTERVAL HISTORY Erin Crawford is a 57 y.o. female who has above history reviewed by me today presents for follow up visit for management of anemia of chronic kidney disease.  Problems and complaints are listed below: Patient reports feeling well today. Patient had [facility] labs done yesterday. Full report is not available at the time of the clinic. Preliminary hemoglobin 9.1. Denies any chest pain, lower extremity swelling, abdominal pain. hronic fatigue. Denies any bleeding events, chest pain, nausea or vomiting  Review of Systems  Constitutional: Positive for fatigue. Negative for appetite change, chills and fever.  HENT:   Negative for hearing loss and voice change.   Eyes:  Negative for eye problems.  Respiratory: Negative for chest tightness and cough.   Cardiovascular: Negative for chest pain.  Gastrointestinal: Negative for abdominal distention, abdominal pain and blood in stool.  Endocrine: Negative for hot flashes.  Genitourinary: Negative for difficulty urinating and frequency.   Musculoskeletal: Negative for arthralgias.  Skin: Negative for itching and rash.  Neurological: Negative for extremity weakness.  Hematological: Negative for adenopathy.  Psychiatric/Behavioral: Negative for confusion.    MEDICAL HISTORY:  Past Medical History:  Diagnosis Date  . Diabetes mellitus without complication ([facility])   . Hypercholesteremia   . Hypertension   . LGSIL (low grade squamous intraepithelial dysplasia)   . Menopause     SURGICAL HISTORY: Past Surgical History:  Procedure Laterality Date  . COLPOSCOPY    . TUBAL LIGATION      SOCIAL HISTORY: Social History   Socioeconomic History  . Marital status: Married    Spouse name: Not on file  . Number of children: Not on file  . Years of education: Not on file  . Highest education level: Not on file  Occupational History  . Not on file  Social Needs  . Financial resource strain: Not on file  . Food insecurity    Worry: Not on file    Inability: Not on file  . Transportation needs    Medical: Not on file    Non-medical: Not on file  Tobacco Use  . Smoking status: Never Smoker  . Smokeless tobacco: Never Used  Substance and Sexual   Activity  . Alcohol use: No  . Drug use: No  . Sexual activity: Yes  Lifestyle  . Physical activity    Days per week: Not on file    Minutes per session: Not on file  . Stress: Not on file  Relationships  . Social Herbalist on phone: Not on file    Gets together: Not on file    Attends religious service: Not on file    Active member of club or organization: Not on file    Attends meetings of clubs or organizations: Not on file    Relationship  status: Not on file  . Intimate partner violence    Fear of current or ex partner: Not on file    Emotionally abused: Not on file    Physically abused: Not on file    Forced sexual activity: Not on file  Other Topics Concern  . Not on file  Social History Narrative  . Not on file    FAMILY HISTORY: Family History  Problem Relation Age of Onset  . Cancer Mother        colon  . Cancer Father        lung  . Cirrhosis Father   . Diabetes Maternal Grandmother   . Breast cancer Neg Hx     ALLERGIES:  has No Known Allergies.  MEDICATIONS:  Current Outpatient Medications  Medication Sig Dispense Refill  . amLODipine (NORVASC) 10 MG tablet TAKE 1 TABLET BY MOUTH ONCE DAILY    . Ascorbic Acid 500 MG CAPS Take by mouth.    Marland Kitchen atorvastatin (LIPITOR) 20 MG tablet Take 20 mg by mouth daily at 6 PM.    . carvedilol (COREG) 6.25 MG tablet Take 6.25 mg by mouth 2 (two) times daily.    . Cholecalciferol (VITAMIN D-1000 MAX ST) 1000 UNITS tablet Take 1 tablet by mouth daily.    . dorzolamide-timolol (COSOPT) 22.3-6.8 MG/ML ophthalmic solution INSTILL 1 DROP INTO EACH EYE TWICE DAILY    . ELDERBERRY PO Take by mouth.    Marland Kitchen glimepiride (AMARYL) 2 MG tablet TAKE 1 TABLET BY MOUTH ONCE DAILY WITH BREAKFAST    . glucose blood (ONE TOUCH ULTRA TEST) test strip Use 2 (two) times daily. Use as instructed.    . hydrALAZINE (APRESOLINE) 25 MG tablet Take 1 tablet by mouth 3 (three) times daily.    . hydrochlorothiazide (HYDRODIURIL) 25 MG tablet TAKE 1 TABLET BY MOUTH ONCE DAILY    . ibuprofen (ADVIL,MOTRIN) 800 MG tablet Take 1 tablet (800 mg total) by mouth every 8 (eight) hours as needed. 30 tablet 0  . lisinopril (PRINIVIL,ZESTRIL) 40 MG tablet TAKE 1 TABLET BY MOUTH ONCE DAILY    . Multiple Vitamin (MULTI-VITAMINS) TABS Take 1 tablet by mouth daily.    . TRULICITY 1.5 UE/4.5WU SOPN     . Dulaglutide 1.5 MG/0.5ML SOPN Inject into the skin. Inject 1.5 mg subcutaneously every 7 days    . Insulin  Glargine, 2 Unit Dial, 300 UNIT/ML SOPN Inject into the skin.    Nelva Nay MAX SOLOSTAR 300 UNIT/ML SOPN      No current facility-administered medications for this visit.      PHYSICAL EXAMINATION: ECOG PERFORMANCE STATUS: 1 - Symptomatic but completely ambulatory Vitals:   07/09/19 1104  BP: (!) 172/81  Pulse: 72  Resp: 16  Temp: 98.2 F (36.8 C)   Filed Weights   07/09/19 1104  Weight: 197 lb 8 oz (89.6 kg)  Physical Exam Constitutional:      General: She is not in acute distress.    Appearance: She is obese.  HENT:     Head: Normocephalic and atraumatic.  Eyes:     General: No scleral icterus.    Pupils: Pupils are equal, round, and reactive to light.  Neck:     Musculoskeletal: Normal range of motion and neck supple.  Cardiovascular:     Rate and Rhythm: Normal rate and regular rhythm.     Heart sounds: Normal heart sounds.  Pulmonary:     Effort: Pulmonary effort is normal. No respiratory distress.     Breath sounds: No wheezing.  Abdominal:     General: Bowel sounds are normal. There is no distension.     Palpations: Abdomen is soft. There is no mass.     Tenderness: There is no abdominal tenderness.  Musculoskeletal: Normal range of motion.        General: No deformity.  Skin:    General: Skin is warm and dry.     Findings: No erythema or rash.  Neurological:     Mental Status: She is alert and oriented to person, place, and time.     Cranial Nerves: No cranial nerve deficit.     Coordination: Coordination normal.  Psychiatric:        Behavior: Behavior normal.        Thought Content: Thought content normal.      LABORATORY DATA:  I have reviewed the data as listed Lab Results  Component Value Date   WBC 7.3 02/08/2015   HGB 11.0 (L) 02/08/2015   HCT 33.5 (L) 02/08/2015   MCV 81.7 02/08/2015   PLT 277 02/08/2015   No results for input(s): NA, K, CL, CO2, GLUCOSE, BUN, CREATININE, CALCIUM, GFRNONAA, GFRAA, PROT, ALBUMIN, AST, ALT, ALKPHOS,  BILITOT, BILIDIR, IBILI in the last 8760 hours. Iron/TIBC/Ferritin/ %Sat No results found for: IRON, TIBC, FERRITIN, IRONPCTSAT   [facility] labs  01/16/2019, she has hemoglobin 8.6, WBC 7.4, platelet 288, MCV 87.  TIBC 14, ferritin 371.   04/15/2019 CBC showed WBC 8.9, hemoglobin 10.3, hematocrit 32.1.  MCV 84, platelet 322 Ferritin 232, iron saturation 17 Creatinine 2.5  RADIOGRAPHIC STUDIES: I have personally reviewed the radiological images as listed and agreed with the findings in the report. 12/17/2017 screening mammogram bilateral:  No mammographic findings suspicious for malignancy.   ASSESSMENT & PLAN:  1. Anemia due to stage 4 chronic kidney disease ([place])   2. Iron deficiency anemia, unspecified iron deficiency anemia type   3. Family history of colon cancer in mother    #[facility] 07/08/2019 Hb 9.1, wbc 7.9, MCV 86, plt 283.  Plan to repeat iron panel again. If ferritin has decreased, may give additional IV venofer.  Continue lab work CBC monthly, and  Procrit, hold procrit if Hb>10  # family history of colon cancer, Cancer appropriate screening: recommend colonoscopy. She is also due to annual mammogram discussed with patient..  Her anemia was felt to be a combination of iron deficiency and anemia due to CKD.  Recommend patient to follow-up with gastroenterology for repeat colonoscopy.   All questions were answered. The patient knows to call the clinic with any problems questions or concerns.  cc PCP Return of visit: 3 months.   Earlie Server, MD, PhD Hematology Oncology [place] at [facility] 07/09/2019

## 2019-07-10 ENCOUNTER — Telehealth: Payer: Self-pay

## 2019-07-10 NOTE — Telephone Encounter (Signed)
Called patient to notify her that Dr. Tasia Catchings is requesting additional blood work prior to setting up treatment plan. Patient states she will get blood work tomorrow. Lab orders faxed to [facility] on [address] (fx# [phone])

## 2019-07-14 ENCOUNTER — Encounter: Payer: Self-pay | Admitting: Oncology

## 2019-08-28 ENCOUNTER — Encounter: Payer: 59 | Admitting: Obstetrics and Gynecology

## 2019-09-10 ENCOUNTER — Other Ambulatory Visit: Payer: Self-pay

## 2019-09-10 ENCOUNTER — Ambulatory Visit (INDEPENDENT_AMBULATORY_CARE_PROVIDER_SITE_OTHER): Payer: Managed Care, Other (non HMO) | Admitting: Certified Nurse Midwife

## 2019-09-10 ENCOUNTER — Encounter: Payer: Self-pay | Admitting: Certified Nurse Midwife

## 2019-09-10 VITALS — BP 182/79 | HR 83 | Ht 62.0 in | Wt 201.1 lb

## 2019-09-10 DIAGNOSIS — E119 Type 2 diabetes mellitus without complications: Secondary | ICD-10-CM | POA: Insufficient documentation

## 2019-09-10 DIAGNOSIS — E669 Obesity, unspecified: Secondary | ICD-10-CM | POA: Insufficient documentation

## 2019-09-10 DIAGNOSIS — Z1211 Encounter for screening for malignant neoplasm of colon: Secondary | ICD-10-CM | POA: Diagnosis not present

## 2019-09-10 DIAGNOSIS — Z01419 Encounter for gynecological examination (general) (routine) without abnormal findings: Secondary | ICD-10-CM | POA: Diagnosis not present

## 2019-09-10 DIAGNOSIS — I10 Essential (primary) hypertension: Secondary | ICD-10-CM | POA: Insufficient documentation

## 2019-09-10 NOTE — Patient Instructions (Signed)

## 2019-09-10 NOTE — Progress Notes (Addendum)
GYNECOLOGY ANNUAL PREVENTATIVE CARE ENCOUNTER NOTE  History:     Erin Crawford is a 58 y.o. G0P0000 female here for a routine annual gynecologic exam.  Current complaints: none.   Denies abnormal vaginal bleeding, discharge, pelvic pain, problems with intercourse or other gynecologic concerns.     Social History: Sexual: heterosexual Marital Status: married x 8 yrs, sexually active Living situation: with family Occupation: unknown occupation Tobacco/alcohol: no tobacco use Illicit drugs: no history of illicit drug use Exercise- order stationary bike, plans to exercise on daily x 30 min.   Gynecologic History No LMP recorded. Patient is postmenopausal. Contraception: post menopausal status Last Pap: 08/17/2015. Results were: normal with negative HPV Last mammogram: 04/2019. Results were: normal  Obstetric History OB History  Gravida Para Term Preterm AB Living  0 0 0 0 0 0  SAB TAB Ectopic Multiple Live Births  0 0 0 0 0    Past Medical History:  Diagnosis Date  . Anemia   . Diabetes mellitus without complication ([place])   . Hypercholesteremia   . Hypertension   . LGSIL (low grade squamous intraepithelial dysplasia)   . Menopause     Past Surgical History:  Procedure Laterality Date  . COLPOSCOPY    . TUBAL LIGATION      Current Outpatient Medications on File Prior to Visit  Medication Sig Dispense Refill  . amLODipine (NORVASC) 10 MG tablet TAKE 1 TABLET BY MOUTH ONCE DAILY    . Ascorbic Acid 500 MG CAPS Take by mouth.    Marland Kitchen atorvastatin (LIPITOR) 20 MG tablet Take 20 mg by mouth daily at 6 PM.    . carvedilol (COREG) 6.25 MG tablet Take 6.25 mg by mouth 2 (two) times daily.    . Cholecalciferol (VITAMIN D-1000 MAX ST) 1000 UNITS tablet Take 1 tablet by mouth daily.    . dorzolamide-timolol (COSOPT) 22.3-6.8 MG/ML ophthalmic solution INSTILL 1 DROP INTO EACH EYE TWICE DAILY    . Dulaglutide 1.5 MG/0.5ML SOPN Inject into the skin. Inject 1.5 mg subcutaneously  every 7 days    . ELDERBERRY PO Take by mouth.    Marland Kitchen glimepiride (AMARYL) 2 MG tablet TAKE 1 TABLET BY MOUTH ONCE DAILY WITH BREAKFAST    . glucose blood (ONE TOUCH ULTRA TEST) test strip Use 2 (two) times daily. Use as instructed.    . hydrALAZINE (APRESOLINE) 25 MG tablet Take 1 tablet by mouth 3 (three) times daily.    . hydrochlorothiazide (HYDRODIURIL) 25 MG tablet TAKE 1 TABLET BY MOUTH ONCE DAILY    . ibuprofen (ADVIL,MOTRIN) 800 MG tablet Take 1 tablet (800 mg total) by mouth every 8 (eight) hours as needed. 30 tablet 0  . Insulin Glargine, 2 Unit Dial, 300 UNIT/ML SOPN Inject into the skin.    Marland Kitchen lisinopril (PRINIVIL,ZESTRIL) 40 MG tablet TAKE 1 TABLET BY MOUTH ONCE DAILY    . Multiple Vitamin (MULTI-VITAMINS) TABS Take 1 tablet by mouth daily.    . TOUJEO MAX SOLOSTAR 300 UNIT/ML SOPN     . TRULICITY 1.5 NT/6.1WE SOPN      No current facility-administered medications on file prior to visit.    No Known Allergies  Social History:  reports that she has never smoked. She has never used smokeless tobacco. She reports that she does not drink alcohol or use drugs.  Family History  Problem Relation Age of Onset  . Cancer Mother        colon  . Cancer Father  lung  . Cirrhosis Father   . Diabetes Maternal Grandmother   . Breast cancer Neg Hx     The following portions of the patient's history were reviewed and updated as appropriate: allergies, current medications, past family history, past medical history, past social history, past surgical history and problem list.  Review of Systems Pertinent items noted in HPI and remainder of comprehensive ROS otherwise negative.  Physical Exam:  BP (!) 182/79   Pulse 83   Ht 5\' 2"  (1.575 m)   Wt 201 lb 1 oz (91.2 kg)   BMI 36.77 kg/m  CONSTITUTIONAL: Well-developed, well-nourished, obese, female in no acute distress.  HENT:  Normocephalic, atraumatic, External right and left ear normal. Oropharynx is clear and moist EYES:  Conjunctivae and EOM are normal. Pupils are equal, round, and reactive to light. No scleral icterus.  NECK: Normal range of motion, supple, no masses.  Normal thyroid.  SKIN: Skin is warm and dry. No rash noted. Not diaphoretic. No erythema. No pallor. MUSCULOSKELETAL: Normal range of motion. No tenderness.  No cyanosis, clubbing, or edema.  2+ distal pulses. NEUROLOGIC: Alert and oriented to person, place, and time. Normal reflexes, muscle tone coordination.  PSYCHIATRIC: Normal mood and affect. Normal behavior. Normal judgment and thought content. CARDIOVASCULAR: Normal heart rate noted, regular rhythm RESPIRATORY: Clear to auscultation bilaterally. Effort and breath sounds normal, no problems with respiration noted. BREASTS: Symmetric in size. No masses, tenderness, skin changes, nipple drainage, or lymphadenopathy bilaterally. ABDOMEN: Soft, no distention noted.  No tenderness, rebound or guarding.  PELVIC: Normal appearing external genitalia and urethral meatus; normal appearing vaginal mucosa and cervix with normal atrophy.  No abnormal discharge noted.  Pap smear not due.   Normal uterine size, no other palpable masses, no uterine or adnexal tenderness.   Assessment and Plan:  Annual Well Women GYN Exam Pap smear not indicated until 2021 Mammogram done 2020 due 2021 Lab: none due Colonoscopy ordered Routine preventative health maintenance measures emphasized. Please refer to After Visit Summary for other counseling recommendations.      Philip Aspen, CNM

## 2019-09-15 ENCOUNTER — Telehealth: Payer: Self-pay

## 2019-09-15 ENCOUNTER — Other Ambulatory Visit: Payer: Self-pay

## 2019-09-15 DIAGNOSIS — Z86010 Personal history of colonic polyps: Secondary | ICD-10-CM

## 2019-09-15 MED ORDER — NA SULFATE-K SULFATE-MG SULF 17.5-3.13-1.6 GM/177ML PO SOLN: 354.0000 mL | Freq: Once | ORAL | 0 refills | Status: AC

## 2019-09-15 NOTE — Telephone Encounter (Signed)
Gastroenterology Pre-Procedure Review    PATIENT REVIEW QUESTIONS: The patient responded to the following health history questions as indicated:    1. Are you having any GI issues? no 2. Do you have a personal history of Polyps? yes (5 years ago ) 3. Do you have a family history of Colon Cancer or Polyps? yes (Mom had colon cancer ) 4. Diabetes Mellitus? yes (type 2 on Insulin ) 5. Joint replacements in the past 12 months?no 6. Major health problems in the past 3 months?no 7. Any artificial heart valves, MVP, or defibrillator?no    MEDICATIONS & ALLERGIES:    Patient reports the following regarding taking any anticoagulation/antiplatelet therapy:   Plavix, Coumadin, Eliquis, Xarelto, Lovenox, Pradaxa, Brilinta, or Effient? no Aspirin? no  Patient confirms/reports the following medications:  Current Outpatient Medications  Medication Sig Dispense Refill  . amLODipine (NORVASC) 10 MG tablet TAKE 1 TABLET BY MOUTH ONCE DAILY    . Ascorbic Acid 500 MG CAPS Take by mouth.    Marland Kitchen atorvastatin (LIPITOR) 20 MG tablet Take 20 mg by mouth daily at 6 PM.    . carvedilol (COREG) 6.25 MG tablet Take 6.25 mg by mouth 2 (two) times daily.    . Cholecalciferol (VITAMIN D-1000 MAX ST) 1000 UNITS tablet Take 1 tablet by mouth daily.    . dorzolamide-timolol (COSOPT) 22.3-6.8 MG/ML ophthalmic solution INSTILL 1 DROP INTO EACH EYE TWICE DAILY    . Dulaglutide 1.5 MG/0.5ML SOPN Inject into the skin. Inject 1.5 mg subcutaneously every 7 days    . ELDERBERRY PO Take by mouth.    Marland Kitchen glimepiride (AMARYL) 2 MG tablet TAKE 1 TABLET BY MOUTH ONCE DAILY WITH BREAKFAST    . glucose blood (ONE TOUCH ULTRA TEST) test strip Use 2 (two) times daily. Use as instructed.    . hydrALAZINE (APRESOLINE) 25 MG tablet Take 1 tablet by mouth 3 (three) times daily.    . hydrochlorothiazide (HYDRODIURIL) 25 MG tablet TAKE 1 TABLET BY MOUTH ONCE DAILY    . ibuprofen (ADVIL,MOTRIN) 800 MG tablet Take 1 tablet (800 mg total) by  mouth every 8 (eight) hours as needed. 30 tablet 0  . Insulin Glargine, 2 Unit Dial, 300 UNIT/ML SOPN Inject into the skin.    Marland Kitchen lisinopril (PRINIVIL,ZESTRIL) 40 MG tablet TAKE 1 TABLET BY MOUTH ONCE DAILY    . Multiple Vitamin (MULTI-VITAMINS) TABS Take 1 tablet by mouth daily.    . TOUJEO MAX SOLOSTAR 300 UNIT/ML SOPN     . TRULICITY 1.5 ZS/0.1UX SOPN      No current facility-administered medications for this visit.    Patient confirms/reports the following allergies:  No Known Allergies  No orders of the defined types were placed in this encounter.   AUTHORIZATION INFORMATION Primary Insurance: 1D#: Group #:  Secondary Insurance: 1D#: Group #:  SCHEDULE INFORMATION: Date: 10/07/2019 Time: Location: [place]

## 2019-09-16 ENCOUNTER — Encounter: Payer: 59 | Admitting: Certified Nurse Midwife

## 2019-10-01 ENCOUNTER — Other Ambulatory Visit: Payer: Self-pay

## 2019-10-01 ENCOUNTER — Encounter: Payer: Self-pay | Admitting: Gastroenterology

## 2019-10-03 ENCOUNTER — Other Ambulatory Visit: Payer: Self-pay

## 2019-10-03 ENCOUNTER — Other Ambulatory Visit
Admission: RE | Admit: 2019-10-03 | Discharge: 2019-10-03 | Disposition: A | Discharge status: Home / Self Care | Payer: Commercial Managed Care - PPO | Source: Ambulatory Visit | Attending: Gastroenterology | Admitting: Gastroenterology

## 2019-10-03 DIAGNOSIS — Z01812 Encounter for preprocedural laboratory examination: Secondary | ICD-10-CM | POA: Diagnosis present

## 2019-10-03 DIAGNOSIS — Z20822 Contact with and (suspected) exposure to covid-19: Secondary | ICD-10-CM | POA: Insufficient documentation

## 2019-10-03 LAB — SARS CORONAVIRUS 2 (TAT 6-24 HRS): SARS Coronavirus 2: NEGATIVE

## 2019-10-03 NOTE — Discharge Instructions (Signed)
General Anesthesia, Adult, Care After This sheet gives you information about how to care for yourself after your procedure. Your health care provider may also give you more specific instructions. If you have problems or questions, contact your health care provider. What can I expect after the procedure? After the procedure, the following side effects are common:  Pain or discomfort at the IV site.  Nausea.  Vomiting.  Sore throat.  Trouble concentrating.  Feeling cold or chills.  Weak or tired.  Sleepiness and fatigue.  Soreness and body aches. These side effects can affect parts of the body that were not involved in surgery. Follow these instructions at home:  For at least 24 hours after the procedure:  Have a responsible adult stay with you. It is important to have someone help care for you until you are awake and alert.  Rest as needed.  Do not: ? Participate in activities in which you could fall or become injured. ? Drive. ? Use heavy machinery. ? Drink alcohol. ? Take sleeping pills or medicines that cause drowsiness. ? Make important decisions or sign legal documents. ? Take care of children on your own. Eating and drinking  Follow any instructions from your health care provider about eating or drinking restrictions.  When you feel hungry, start by eating small amounts of foods that are soft and easy to digest (bland), such as toast. Gradually return to your regular diet.  Drink enough fluid to keep your urine pale yellow.  If you vomit, rehydrate by drinking water, juice, or clear broth. General instructions  If you have sleep apnea, surgery and certain medicines can increase your risk for breathing problems. Follow instructions from your health care provider about wearing your sleep device: ? Anytime you are sleeping, including during daytime naps. ? While taking prescription pain medicines, sleeping medicines, or medicines that make you drowsy.  Return to  your normal activities as told by your health care provider. Ask your health care provider what activities are safe for you.  Take over-the-counter and prescription medicines only as told by your health care provider.  If you smoke, do not smoke without supervision.  Keep all follow-up visits as told by your health care provider. This is important. Contact a health care provider if:  You have nausea or vomiting that does not get better with medicine.  You cannot eat or drink without vomiting.  You have pain that does not get better with medicine.  You are unable to pass urine.  You develop a skin rash.  You have a fever.  You have redness around your IV site that gets worse. Get help right away if:  You have difficulty breathing.  You have chest pain.  You have blood in your urine or stool, or you vomit blood. Summary  After the procedure, it is common to have a sore throat or nausea. It is also common to feel tired.  Have a responsible adult stay with you for the first 24 hours after general anesthesia. It is important to have someone help care for you until you are awake and alert.  When you feel hungry, start by eating small amounts of foods that are soft and easy to digest (bland), such as toast. Gradually return to your regular diet.  Drink enough fluid to keep your urine pale yellow.  Return to your normal activities as told by your health care provider. Ask your health care provider what activities are safe for you. This information is not   intended to replace advice given to you by your health care provider. Make sure you discuss any questions you have with your health care provider. Document Revised: 09/14/2017 Document Reviewed: 04/27/2017 Elsevier Patient Education  2020 [employer]vier Inc.  

## 2019-10-06 ENCOUNTER — Telehealth: Payer: Self-pay

## 2019-10-06 ENCOUNTER — Telehealth: Payer: Self-pay | Admitting: Obstetrics and Gynecology

## 2019-10-06 MED ORDER — GOLYTELY 236 G PO SOLR: 4000.0000 mL | Freq: Once | ORAL | 0 refills | Status: AC

## 2019-10-06 NOTE — Telephone Encounter (Signed)
Medication was not approved for Suprep changed medication

## 2019-10-06 NOTE — Telephone Encounter (Signed)
Pt called in and stated that she needs another order sent over   for her colpo sent it but she needs it to be under her husbands insurance. Please advise.  Pt is requesting a call back

## 2019-10-07 ENCOUNTER — Ambulatory Visit
Admission: RE | Admit: 2019-10-07 | Discharge: 2019-10-07 | Disposition: A | Discharge status: Home / Self Care | Payer: Commercial Managed Care - PPO | Attending: Gastroenterology | Admitting: Gastroenterology

## 2019-10-07 ENCOUNTER — Encounter
Admission: RE | Disposition: A | Discharge status: Home / Self Care | Payer: Self-pay | Source: Home / Self Care | Attending: Gastroenterology

## 2019-10-07 ENCOUNTER — Other Ambulatory Visit: Payer: Self-pay

## 2019-10-07 ENCOUNTER — Telehealth: Payer: Self-pay

## 2019-10-07 ENCOUNTER — Ambulatory Visit: Payer: Commercial Managed Care - PPO | Admitting: Anesthesiology

## 2019-10-07 ENCOUNTER — Encounter: Payer: Self-pay | Admitting: Gastroenterology

## 2019-10-07 DIAGNOSIS — E669 Obesity, unspecified: Secondary | ICD-10-CM | POA: Insufficient documentation

## 2019-10-07 DIAGNOSIS — G4730 Sleep apnea, unspecified: Secondary | ICD-10-CM | POA: Diagnosis not present

## 2019-10-07 DIAGNOSIS — Q402 Other specified congenital malformations of stomach: Secondary | ICD-10-CM | POA: Insufficient documentation

## 2019-10-07 DIAGNOSIS — Z79899 Other long term (current) drug therapy: Secondary | ICD-10-CM | POA: Diagnosis not present

## 2019-10-07 DIAGNOSIS — N189 Chronic kidney disease, unspecified: Secondary | ICD-10-CM | POA: Diagnosis not present

## 2019-10-07 DIAGNOSIS — Z6836 Body mass index (BMI) 36.0-36.9, adult: Secondary | ICD-10-CM | POA: Insufficient documentation

## 2019-10-07 DIAGNOSIS — E1122 Type 2 diabetes mellitus with diabetic chronic kidney disease: Secondary | ICD-10-CM | POA: Diagnosis not present

## 2019-10-07 DIAGNOSIS — E785 Hyperlipidemia, unspecified: Secondary | ICD-10-CM | POA: Diagnosis not present

## 2019-10-07 DIAGNOSIS — K2940 Chronic atrophic gastritis without bleeding: Secondary | ICD-10-CM

## 2019-10-07 DIAGNOSIS — Z794 Long term (current) use of insulin: Secondary | ICD-10-CM | POA: Diagnosis not present

## 2019-10-07 DIAGNOSIS — D122 Benign neoplasm of ascending colon: Secondary | ICD-10-CM | POA: Insufficient documentation

## 2019-10-07 DIAGNOSIS — Z860100 Personal history of colon polyps, unspecified: Secondary | ICD-10-CM

## 2019-10-07 DIAGNOSIS — K635 Polyp of colon: Secondary | ICD-10-CM

## 2019-10-07 DIAGNOSIS — D631 Anemia in chronic kidney disease: Secondary | ICD-10-CM | POA: Insufficient documentation

## 2019-10-07 DIAGNOSIS — Z800 Family history of malignant neoplasm of digestive organs: Secondary | ICD-10-CM | POA: Insufficient documentation

## 2019-10-07 DIAGNOSIS — I10 Essential (primary) hypertension: Secondary | ICD-10-CM | POA: Insufficient documentation

## 2019-10-07 DIAGNOSIS — D509 Iron deficiency anemia, unspecified: Secondary | ICD-10-CM

## 2019-10-07 HISTORY — DX: Sleep apnea, unspecified: G47.30

## 2019-10-07 HISTORY — DX: Presence of dental prosthetic device (complete) (partial): Z97.2

## 2019-10-07 HISTORY — PX: COLONOSCOPY WITH PROPOFOL: SHX5780

## 2019-10-07 HISTORY — PX: POLYPECTOMY: SHX5525

## 2019-10-07 HISTORY — PX: ESOPHAGOGASTRODUODENOSCOPY (EGD) WITH PROPOFOL: SHX5813

## 2019-10-07 LAB — GLUCOSE, CAPILLARY: Glucose-Capillary: 103 mg/dL — ABNORMAL HIGH (ref 70–99)

## 2019-10-07 SURGERY — COLONOSCOPY WITH PROPOFOL
Anesthesia: General | Site: Rectum

## 2019-10-07 MED ORDER — GLYCOPYRROLATE 0.2 MG/ML IJ SOLN
INTRAMUSCULAR | Status: DC | PRN
  Administered 2019-10-07: .1 mg via INTRAVENOUS

## 2019-10-07 MED ORDER — PROPOFOL 10 MG/ML IV BOLUS
INTRAVENOUS | Status: DC | PRN
  Administered 2019-10-07: 30 mg via INTRAVENOUS
  Administered 2019-10-07: 40 mg via INTRAVENOUS
  Administered 2019-10-07: 30 mg via INTRAVENOUS
  Administered 2019-10-07 (×2): 40 mg via INTRAVENOUS
  Administered 2019-10-07: 30 mg via INTRAVENOUS
  Administered 2019-10-07: 40 mg via INTRAVENOUS
  Administered 2019-10-07: 50 mg via INTRAVENOUS
  Administered 2019-10-07: 30 mg via INTRAVENOUS
  Administered 2019-10-07: 40 mg via INTRAVENOUS
  Administered 2019-10-07 (×2): 30 mg via INTRAVENOUS
  Administered 2019-10-07: 40 mg via INTRAVENOUS
  Administered 2019-10-07: 30 mg via INTRAVENOUS
  Administered 2019-10-07: 40 mg via INTRAVENOUS
  Administered 2019-10-07: 30 mg via INTRAVENOUS
  Administered 2019-10-07: 100 mg via INTRAVENOUS

## 2019-10-07 MED ORDER — LACTATED RINGERS IV SOLN: INTRAVENOUS | Status: DC

## 2019-10-07 MED ORDER — LABETALOL HCL 5 MG/ML IV SOLN
5.0000 mg | INTRAVENOUS | Status: DC | PRN
  Administered 2019-10-07: 09:00:00 5 mg via INTRAVENOUS

## 2019-10-07 MED ORDER — STERILE WATER FOR IRRIGATION IR SOLN
Status: DC | PRN
  Administered 2019-10-07: 10:00:00 50 mL

## 2019-10-07 MED ORDER — LIDOCAINE HCL (CARDIAC) PF 100 MG/5ML IV SOSY
PREFILLED_SYRINGE | INTRAVENOUS | Status: DC | PRN
  Administered 2019-10-07: 30 mg via INTRAVENOUS

## 2019-10-07 SURGICAL SUPPLY — 13 items
BLOCK BITE 60FR ADLT L/F GRN (MISCELLANEOUS) ×4 IMPLANT
CANISTER SUCT 1200ML W/VALVE (MISCELLANEOUS) ×4 IMPLANT
ELECT REM PT RETURN 9FT ADLT (ELECTROSURGICAL) ×4
ELECTRODE REM PT RTRN 9FT ADLT (ELECTROSURGICAL) ×2 IMPLANT
FORCEPS BIOP RAD 4 LRG CAP 4 (CUTTING FORCEPS) ×4 IMPLANT
GOWN CVR UNV OPN BCK APRN NK (MISCELLANEOUS) ×4 IMPLANT
GOWN ISOL THUMB LOOP REG UNIV (MISCELLANEOUS) ×4
KIT PRC NS LF DISP ENDO (KITS) ×4 IMPLANT
KIT PROCEDURE OLYMPUS (KITS) ×4
RETRIEVER NET ROTH 2.5X230 LF (MISCELLANEOUS) ×4 IMPLANT
SNARE LASSO HEX 3 IN 1 (INSTRUMENTS) ×4 IMPLANT
TRAP ETRAP POLY (MISCELLANEOUS) ×4 IMPLANT
WATER STERILE IRR 250ML POUR (IV SOLUTION) ×8 IMPLANT

## 2019-10-07 NOTE — Anesthesia Preprocedure Evaluation (Signed)
Anesthesia Evaluation  Patient identified by MRN, date of birth, ID band Patient awake    History of Anesthesia Complications Negative for: history of anesthetic complications  Airway Mallampati: IV  TM Distance: >3 FB   Mouth opening: Limited Mouth Opening Comment: Small mouth Dental no notable dental hx.    Pulmonary sleep apnea and Continuous Positive Airway Pressure Ventilation ,    Pulmonary exam normal        Cardiovascular Exercise Tolerance: Good hypertension, Pt. on medications and Pt. on home beta blockers negative cardio ROS Normal cardiovascular exam     Neuro/Psych negative neurological ROS  negative psych ROS   GI/Hepatic negative GI ROS, Neg liver ROS,   Endo/Other  diabetesObesity BMI 36  Renal/GU Renal InsufficiencyRenal disease (mild, 2/2 DMII)     Musculoskeletal negative musculoskeletal ROS (+)   Abdominal   Peds  Hematology   Anesthesia Other Findings   Reproductive/Obstetrics                             Anesthesia Physical Anesthesia Plan  ASA: II  Anesthesia Plan: General   Post-op Pain Management:    Induction: Intravenous  PONV Risk Score and Plan: 3 and TIVA, Propofol infusion and Treatment may vary due to age or medical condition  Airway Management Planned: Natural Airway and Nasal Cannula  Additional Equipment: None  Intra-op Plan:   Post-operative Plan:   Informed Consent: I have reviewed the patients History and Physical, chart, labs and discussed the procedure including the risks, benefits and alternatives for the proposed anesthesia with the patient or authorized representative who has indicated his/her understanding and acceptance.       Plan Discussed with: CRNA  Anesthesia Plan Comments:         Anesthesia Quick Evaluation

## 2019-10-07 NOTE — Op Note (Signed)
[facility] Gastroenterology Patient Name: Erin Crawford Procedure Date: 10/07/2019 9:16 AM MRN: [id number] Account #: [id number] Date of Birth: May 24, 1961 Admit Type: Outpatient Age: 59 Room: [facility] OR ROOM 1 Gender: Female Note Status: Finalized Procedure:             Upper GI endoscopy Indications:           Iron deficiency anemia Providers:             Missie Gehrig B. Bonna Gains MD, MD Referring MD:          Juluis Rainier (Referring MD) Medicines:             Monitored Anesthesia Care Complications:         No immediate complications. Procedure:             Pre-Anesthesia Assessment:                        - Prior to the procedure, a History and Physical was                         performed, and patient medications, allergies and                         sensitivities were reviewed. The patient's tolerance                         of previous anesthesia was reviewed.                        - The risks and benefits of the procedure and the                         sedation options and risks were discussed with the                         patient. All questions were answered and informed                         consent was obtained.                        - Patient identification and proposed procedure were                         verified prior to the procedure by the physician, the                         nurse, the anesthesiologist, the anesthetist and the                         technician. The procedure was verified in the                         procedure room.                        - ASA Grade Assessment: II - A patient with mild                         systemic disease.  After obtaining informed consent, the endoscope was                         passed under direct vision. Throughout the procedure,                         the patient's blood pressure, pulse, and oxygen                         saturations were monitored continuously.  The was                         introduced through the mouth, and advanced to the                         second part of duodenum. The upper GI endoscopy was                         accomplished with ease. The patient tolerated the                         procedure well. Findings:      The examined esophagus was normal.      Patchy atrophic mucosa was found in the entire examined stomach.       Biopsies were obtained in the gastric body, at the incisura and in the       gastric antrum with cold forceps for histology.      Patchy mild mucosal changes characterized by polypoid appearance were       found in the duodenal bulb. Biopsies were taken with a cold forceps for       histology.      The second portion of the duodenum was normal. Biopsies for histology       were taken with a cold forceps for evaluation of celiac disease. Impression:            - Normal esophagus.                        - Gastric mucosal atrophy.                        - Mucosal changes in the duodenum. Biopsied.                        - Normal second portion of the duodenum. Biopsied.                        - Biopsies were obtained in the gastric body, at the                         incisura and in the gastric antrum. Recommendation:        - Await pathology results.                        - Discharge patient to home (with escort).                        - Advance diet as tolerated.                        -  Continue present medications.                        - Patient has a contact number available for                         emergencies. The signs and symptoms of potential                         delayed complications were discussed with the patient.                         Return to normal activities tomorrow. Written                         discharge instructions were provided to the patient.                        - Discharge patient to home (with escort).                        - The findings and  recommendations were discussed with                         the patient.                        - The findings and recommendations were discussed with                         the patient's family.                        - Return to GI clinic in 4 weeks. Procedure Code(s):     --- Professional ---                        [phone], Esophagogastroduodenoscopy, flexible,                         transoral; with biopsy, single or multiple Diagnosis Code(s):     --- Professional ---                        K31.89, Other diseases of stomach and duodenum                        D50.9, Iron deficiency anemia, unspecified CPT copyright 2019 American Medical Association. All rights reserved. The codes documented in this report are preliminary and upon coder review may  be revised to meet current compliance requirements.  [name], MD Margretta Sidle B. Bonna Gains MD, MD 10/07/2019 9:46:21 AM This report has been signed electronically. Number of Addenda: 0 Note Initiated On: 10/07/2019 9:16 AM Total Procedure Duration: 0 hours 10 minutes 33 seconds  Estimated Blood Loss:  Estimated blood loss: none.      [facility]

## 2019-10-07 NOTE — Telephone Encounter (Signed)
-----   Message from Virgel Manifold, MD sent at 10/07/2019  9:17 AM EST ----- This pt was referred to [place] by PCP for a colonoscopy. But should be set up for a new patient appointment for iron deficiency anemia with any provider. Please call her today or tomorrow as she is in procedures this morning.

## 2019-10-07 NOTE — Anesthesia Postprocedure Evaluation (Signed)
Anesthesia Post Note  Patient: Erin Crawford  Procedure(s) Performed: COLONOSCOPY WITH BIOPSY (N/A Rectum) ESOPHAGOGASTRODUODENOSCOPY (EGD) WITH BIOPSY (N/A Mouth) POLYPECTOMY (N/A Rectum)     Patient location during evaluation: PACU Anesthesia Type: General Level of consciousness: awake and alert Pain management: pain level controlled Vital Signs Assessment: post-procedure vital signs reviewed and stable Respiratory status: spontaneous breathing, nonlabored ventilation, respiratory function stable and patient connected to nasal cannula oxygen Cardiovascular status: blood pressure returned to baseline and stable Postop Assessment: no apparent nausea or vomiting Anesthetic complications: no    Adele Barthel Arlethia Basso

## 2019-10-07 NOTE — Telephone Encounter (Signed)
Spoke with pt and she stated that she had the process done today without any problems.

## 2019-10-07 NOTE — Anesthesia Procedure Notes (Signed)
Date/Time: 10/07/2019 9:26 AM Performed by: Cameron Ali, CRNA Pre-anesthesia Checklist: Patient identified, Emergency Drugs available, Suction available, Timeout performed and Patient being monitored Patient Re-evaluated:Patient Re-evaluated prior to induction Oxygen Delivery Method: Nasal cannula Placement Confirmation: positive ETCO2

## 2019-10-07 NOTE — Op Note (Signed)
[facility] Gastroenterology Patient Name: Erin Crawford Procedure Date: 10/07/2019 8:39 AM MRN: [id number] Account #: [id number] Date of Birth: [birth year] Admit Type: Outpatient Age: 59 Room: [facility] OR ROOM 01 Gender: Female Note Status: Finalized Procedure:             Colonoscopy Indications:           High risk colon cancer surveillance: Personal history                         of colonic polyps Providers:             Dakiyah Heinke B. Bonna Gains MD, MD Referring MD:          Juluis Rainier (Referring MD) Medicines:             Monitored Anesthesia Care Complications:         No immediate complications. Procedure:             Pre-Anesthesia Assessment:                        - ASA Grade Assessment: II - A patient with mild                         systemic disease.                        - Prior to the procedure, a History and Physical was                         performed, and patient medications, allergies and                         sensitivities were reviewed. The patient's tolerance                         of previous anesthesia was reviewed.                        - The risks and benefits of the procedure and the                         sedation options and risks were discussed with the                         patient. All questions were answered and informed                         consent was obtained.                        - Patient identification and proposed procedure were                         verified prior to the procedure by the physician, the                         nurse, the anesthesiologist, the anesthetist and the                         technician. The procedure was  verified in the                         procedure room.                        After obtaining informed consent, the colonoscope was                         passed under direct vision. Throughout the procedure,                         the patient's blood pressure, pulse, and  oxygen                         saturations were monitored continuously. The was                         introduced through the anus and advanced to the the                         cecum, identified by appendiceal orifice and ileocecal                         valve. The colonoscopy was performed with ease. The                         patient tolerated the procedure well. The quality of                         the bowel preparation was fair. Findings:      The perianal and digital rectal examinations were normal.      A 12 mm polyp was found in the ascending colon. The polyp was       semi-pedunculated. The polyp was removed with a hot snare. Resection and       retrieval were complete.      The exam was otherwise without abnormality.      The rectum, sigmoid colon, descending colon, transverse colon, ascending       colon and cecum appeared normal.      The retroflexed view of the distal rectum and anal verge was normal and       showed no anal or rectal abnormalities. Impression:            - Preparation of the colon was fair.                        - One 12 mm polyp in the ascending colon, removed with                         a hot snare. Resected and retrieved.                        - The examination was otherwise normal.                        - The rectum, sigmoid colon, descending colon,                         transverse  colon, ascending colon and cecum are normal.                        - The distal rectum and anal verge are normal on                         retroflexion view. Recommendation:        - Discharge patient to home (with escort).                        - Advance diet as tolerated.                        - Continue present medications.                        - Await pathology results.                        - Repeat colonoscopy in 1 year, with 2 day prep.                        - The findings and recommendations were discussed with                         the  patient.                        - The findings and recommendations were discussed with                         the patient's family.                        - Return to primary care physician as previously                         scheduled. Procedure Code(s):     --- Professional ---                        [phone], Colonoscopy, flexible; with removal of                         tumor(s), polyp(s), or other lesion(s) by snare                         technique Diagnosis Code(s):     --- Professional ---                        Z86.010, Personal history of colonic polyps                        K63.5, Polyp of colon CPT copyright 2019 American Medical Association. All rights reserved. The codes documented in this report are preliminary and upon coder review may  be revised to meet current compliance requirements.  [name], MD Margretta Sidle B. Bonna Gains MD, MD 10/07/2019 10:19:58 AM This report has been signed electronically. Number of Addenda: 0 Note Initiated On: 10/07/2019 8:39 AM Scope Withdrawal Time: 0 hours 21 minutes 13 seconds  Total Procedure Duration: 0 hours  29 minutes 6 seconds  Estimated Blood Loss:  Estimated blood loss: none.      [facility]

## 2019-10-07 NOTE — H&P (Addendum)
Erin Antigua, MD [address], [place], [place], [place], [address] [address], [place], [place], [place], [address] Phone: [phone]  Fax: [phone]  Primary Care Physician:  [name], NP   Pre-Procedure History & Physical: HPI:  Erin Crawford is a 59 y.o. female is here for a colonoscopy.   Past Medical History:  Diagnosis Date  . Anemia   . Diabetes mellitus without complication ([place])   . Hypercholesteremia   . Hypertension   . LGSIL (low grade squamous intraepithelial dysplasia)   . Menopause   . Sleep apnea    CPAP - (uses sporadically)  . Wears dentures    partial lower    Past Surgical History:  Procedure Laterality Date  . COLPOSCOPY    . TUBAL LIGATION      Prior to Admission medications   Medication Sig Start Date End Date Taking? Authorizing Provider  amLODipine (NORVASC) 10 MG tablet TAKE 1 TABLET BY MOUTH ONCE DAILY 06/29/17  Yes [provider]  Ascorbic Acid 500 MG CAPS Take by mouth.   Yes [provider]  atorvastatin (LIPITOR) 20 MG tablet Take 20 mg by mouth daily at 6 PM.   Yes [provider]  carvedilol (COREG) 6.25 MG tablet Take 6.25 mg by mouth 2 (two) times daily. 09/30/18  Yes [provider]  Cholecalciferol (VITAMIN D-1000 MAX ST) 1000 UNITS tablet Take 1 tablet by mouth daily.   Yes [provider]  dorzolamide-timolol (COSOPT) 22.3-6.8 MG/ML ophthalmic solution INSTILL 1 DROP INTO EACH EYE TWICE DAILY 08/31/18  Yes [provider]  Dulaglutide 1.5 MG/0.5ML SOPN Inject into the skin. Inject 1.5 mg subcutaneously every 7 days 10/15/18  Yes [provider]  ELDERBERRY PO Take by mouth.   Yes [provider]  hydrALAZINE (APRESOLINE) 25 MG tablet Take 1 tablet by mouth 3 (three) times daily. 09/30/18  Yes [provider]  hydrochlorothiazide (HYDRODIURIL) 25 MG tablet TAKE 1 TABLET BY MOUTH ONCE DAILY 06/29/17  Yes [provider]   ibuprofen (ADVIL,MOTRIN) 800 MG tablet Take 1 tablet (800 mg total) by mouth every 8 (eight) hours as needed. 03/08/17  Yes Laban Emperor, PA-C  Insulin Glargine, 2 Unit Dial, 300 UNIT/ML SOPN Inject into the skin. Currently 32 units daily 01/21/18  Yes [provider]  lisinopril (PRINIVIL,ZESTRIL) 40 MG tablet TAKE 1 TABLET BY MOUTH ONCE DAILY 06/29/17  Yes [provider]  Multiple Vitamin (MULTI-VITAMINS) TABS Take 1 tablet by mouth daily.   Yes [provider]  glimepiride (AMARYL) 2 MG tablet TAKE 1 TABLET BY MOUTH ONCE DAILY WITH BREAKFAST 05/20/19   [provider]  glucose blood (ONE TOUCH ULTRA TEST) test strip Use 2 (two) times daily. Use as instructed. 12/29/14   [provider]    Allergies as of 09/15/2019  . (No Known Allergies)    Family History  Problem Relation Age of Onset  . Cancer Mother        colon  . Cancer Father        lung  . Cirrhosis Father   . Diabetes Maternal Grandmother   . Breast cancer Neg Hx     Social History   Socioeconomic History  . Marital status: Married    Spouse name: Not on file  . Number of children: Not on file  . Years of education: Not on file  . Highest education level: Not on file  Occupational History  . Not on file  Tobacco Use  .  Smoking status: Never Smoker  . Smokeless tobacco: Never Used  Substance and Sexual Activity  . Alcohol use: No  . Drug use: No  . Sexual activity: Yes    Birth control/protection: None  Other Topics Concern  . Not on file  Social History Narrative  . Not on file   Social Determinants of Health   Financial Resource Strain:   . Difficulty of Paying Living Expenses: Not on file  Food Insecurity:   . Worried About Charity fundraiser in the Last Year: Not on file  . Ran Out of Food in the Last Year: Not on file  Transportation Needs:   . Lack of Transportation (Medical): Not on file  . Lack of Transportation (Non-Medical): Not on file  Physical  Activity:   . Days of Exercise per Week: Not on file  . Minutes of Exercise per Session: Not on file  Stress:   . Feeling of Stress : Not on file  Social Connections:   . Frequency of Communication with Friends and Family: Not on file  . Frequency of Social Gatherings with Friends and Family: Not on file  . Attends Religious Services: Not on file  . Active Member of Clubs or Organizations: Not on file  . Attends Archivist Meetings: Not on file  . Marital Status: Not on file  Intimate Partner Violence:   . Fear of Current or Ex-Partner: Not on file  . Emotionally Abused: Not on file  . Physically Abused: Not on file  . Sexually Abused: Not on file    Review of Systems: See HPI, otherwise negative ROS  Physical Exam: BP (!) 156/75   Pulse 81   Temp 97.8 F (36.6 C) (Temporal)   Ht 5\' 2"  (1.575 m)   Wt 89.8 kg   SpO2 100%   BMI 36.21 kg/m  General:   Alert,  pleasant and cooperative in NAD Head:  Normocephalic and atraumatic. Neck:  Supple; no masses or thyromegaly. Lungs:  Clear throughout to auscultation, normal respiratory effort.    Heart:  +S1, +S2, Regular rate and rhythm, No edema. Abdomen:  Soft, nontender and nondistended. Normal bowel sounds, without guarding, and without rebound.   Neurologic:  Alert and  oriented x4;  grossly normal neurologically.  Impression/Plan: Erin Crawford is here for a colonoscopy to be performed for history of colon polyps, family histotry of colon cancer in mother.  This pt has not been seen in clinic prior to the procedure. She mentions history of iron deficiency and receiving IV iron replacement. On review of her chart she sees Dr. Tasia Catchings of hematology for iron deficiency anemia and Anemia of chronic disease and has required iron replacement. No prior EGDs. EGD indicated for Iron deficiency anemia. Option of scheduling an EGD at a later time vs now was discussed and pt agreeable. We did explain to her that this would be a  separate procedure and a separate bill and if she would like to think about that and talk with her insurance she could choose to do the EGD later. Pt. Would like to go ahead and proceed with EGD along with colonoscopy. She should also follow up in GI clinic for iron deficiency anemia and may need a small bowel capsule after evaluation.   Risks, benefits, limitations, and alternatives regarding  colonoscopy have been reviewed with the patient.  Questions have been answered.  All parties agreeable.   Virgel Manifold, MD  10/07/2019, 9:07 AM

## 2019-10-07 NOTE — Transfer of Care (Signed)
Immediate Anesthesia Transfer of Care Note  Patient: Erin Crawford  Procedure(s) Performed: COLONOSCOPY WITH BIOPSY (N/A Rectum) ESOPHAGOGASTRODUODENOSCOPY (EGD) WITH BIOPSY (N/A Mouth) POLYPECTOMY (N/A Rectum)  Patient Location: PACU  Anesthesia Type: General  Level of Consciousness: awake, alert  and patient cooperative  Airway and Oxygen Therapy: Patient Spontanous Breathing and Patient connected to supplemental oxygen  Post-op Assessment: Post-op Vital signs reviewed, Patient's Cardiovascular Status Stable, Respiratory Function Stable, Patent Airway and No signs of Nausea or vomiting  Post-op Vital Signs: Reviewed and stable  Complications: No apparent anesthesia complications

## 2019-10-07 NOTE — Telephone Encounter (Signed)
Made patient a virtual appointment on 10/15/2019

## 2019-10-08 ENCOUNTER — Encounter: Payer: Self-pay | Admitting: *Deleted

## 2019-10-09 LAB — SURGICAL PATHOLOGY

## 2019-10-14 ENCOUNTER — Ambulatory Visit (INDEPENDENT_AMBULATORY_CARE_PROVIDER_SITE_OTHER): Payer: Commercial Managed Care - PPO | Admitting: Gastroenterology

## 2019-10-14 DIAGNOSIS — Z86018 Personal history of other benign neoplasm: Secondary | ICD-10-CM

## 2019-10-14 DIAGNOSIS — D649 Anemia, unspecified: Secondary | ICD-10-CM

## 2019-10-14 DIAGNOSIS — N184 Chronic kidney disease, stage 4 (severe): Secondary | ICD-10-CM | POA: Insufficient documentation

## 2019-10-14 DIAGNOSIS — D126 Benign neoplasm of colon, unspecified: Secondary | ICD-10-CM

## 2019-10-14 DIAGNOSIS — E1122 Type 2 diabetes mellitus with diabetic chronic kidney disease: Secondary | ICD-10-CM

## 2019-10-14 DIAGNOSIS — E1129 Type 2 diabetes mellitus with other diabetic kidney complication: Secondary | ICD-10-CM | POA: Insufficient documentation

## 2019-10-14 DIAGNOSIS — E11319 Type 2 diabetes mellitus with unspecified diabetic retinopathy without macular edema: Secondary | ICD-10-CM | POA: Insufficient documentation

## 2019-10-14 NOTE — Progress Notes (Signed)
Erin Sear, MD [address]  [place]  [place], [place] [address]  Main: [phone]  Fax: [phone]    Gastroenterology Consultation Video Visit  Referring Provider:     [name], * Primary Care Physician:  [name], NP Primary Gastroenterologist:  Dr. Cephas Darby Reason for Consultation: Dr. Bonna Gains        HPI:   Erin Crawford is a 59 y.o. female referred by Dr. Dayton Martes, Victoriano Lain, NP  for consultation & management of iron deficiency anemia  Virtual Visit Video Note  I connected with Erin Crawford on 10/14/19 at 10:00 AM EST by video and verified that I am speaking with the correct person using two identifiers.   I discussed the limitations, risks, security and privacy concerns of performing an evaluation and management service by video and the availability of in person appointments. I also discussed with the patient that there may be a patient responsible charge related to this service. The patient expressed understanding and agreed to proceed.  Location of the Patient: Home  Location of the provider: Office  Persons participating in the visit: Patient and provider only   History of Present Illness: Erin Crawford has history of metabolic syndrome, stage IV CKD, insulin-dependent type 2 diabetes who was originally seen by Dr. Bonna Gains for screening colonoscopy and review of her notes revealed iron deficiency anemia per Dr. Tasia Catchings in addition to anemia of chronic kidney disease.  Therefore she underwent upper endoscopy along with colonoscopy.  EGD was unremarkable including gastric and duodenal biopsies.  Colonoscopy revealed 12 mm polyp in ascending colon which was tubular adenoma with no evidence of dysplasia or cancer.  Appointment was made to discuss about iron deficiency anemia.  Patient denies any GI symptoms.  Most recent labs from October 2020 revealed Hemoglobin 9.1, ferritin 351, TIBC 276, iron 49, iron saturation 18%.   Patient received parenteral iron in addition to Procrit in last 6 months.  She reports that her diabetes is under control, most recent hemoglobin A1c 6.2.  She still has stage IV CKD.  She denies smoking or alcohol use Mother with colon cancer   NSAIDs: None  Antiplts/Anticoagulants/Anti thrombotics: None  GI Procedures: EGD and colonoscopy 10/07/2019  - Normal esophagus. - Gastric mucosal atrophy. - Mucosal changes in the duodenum. Biopsied. - Normal second portion of the duodenum. Biopsied. - Biopsies were obtained in the gastric body, at the incisura and in the gastric antrum.  - Preparation of the colon was fair. - One 12 mm polyp in the ascending colon, removed with a hot snare. Resected and retrieved. - The examination was otherwise normal. - The rectum, sigmoid colon, descending colon, transverse colon, ascending colon and cecum are normal. - The distal rectum and anal verge are normal on retroflexion view.  DIAGNOSIS:  A. DUODENUM; COLD BIOPSY:  - DUODENAL MUCOSA WITH NO SIGNIFICANT PATHOLOGIC ALTERATION.  - NEGATIVE FOR FEATURES OF CELIAC DISEASE.  - NEGATIVE FOR DYSPLASIA AND MALIGNANCY.   B. DUODENUM; NODULAR BULB MUCOSA; COLD BIOPSY:  - GASTRIC HETEROTOPIA.  - NEGATIVE FOR DYSPLASIA AND MALIGNANCY.   C. STOMACH, ATROPHIC MUCOSA; COLD BIOPSY:  - GASTRIC OXYNTIC MUCOSA WITH NO SIGNIFICANT PATHOLOGIC ALTERATION.  - NEGATIVE FOR ACTIVE INFLAMMATION AND H PYLORI.  - NEGATIVE FOR DYSPLASIA AND MALIGNANCY.   D. COLON POLYP, ASCENDING; HOT SNARE:  - TUBULAR ADENOMA.  - NEGATIVE FOR HIGH-GRADE DYSPLASIA AND MALIGNANCY.   Past Medical History:  Diagnosis Date  . Anemia   .  Diabetes mellitus without complication ([place])   . Hypercholesteremia   . Hypertension   . LGSIL (low grade squamous intraepithelial dysplasia)   . Menopause   . Sleep apnea    CPAP - (uses sporadically)  . Wears dentures    partial lower    Past Surgical History:  Procedure Laterality Date    . COLONOSCOPY WITH PROPOFOL N/A 10/07/2019   Procedure: COLONOSCOPY WITH BIOPSY;  Surgeon: Virgel Manifold, MD;  Location: [facility];  Service: Endoscopy;  Laterality: N/A;  Diabetic - insulin sleep apnea  . COLPOSCOPY    . ESOPHAGOGASTRODUODENOSCOPY (EGD) WITH PROPOFOL N/A 10/07/2019   Procedure: ESOPHAGOGASTRODUODENOSCOPY (EGD) WITH BIOPSY;  Surgeon: Virgel Manifold, MD;  Location: [facility];  Service: Endoscopy;  Laterality: N/A;  . POLYPECTOMY N/A 10/07/2019   Procedure: POLYPECTOMY;  Surgeon: Virgel Manifold, MD;  Location: [facility];  Service: Endoscopy;  Laterality: N/A;  . TUBAL LIGATION      Current Outpatient Medications:  .  amLODipine (NORVASC) 10 MG tablet, TAKE 1 TABLET BY MOUTH ONCE DAILY, Disp: , Rfl:  .  Ascorbic Acid 500 MG CAPS, Take by mouth., Disp: , Rfl:  .  atorvastatin (LIPITOR) 20 MG tablet, Take 20 mg by mouth daily at 6 PM., Disp: , Rfl:  .  carvedilol (COREG) 6.25 MG tablet, Take 6.25 mg by mouth 2 (two) times daily., Disp: , Rfl:  .  Cholecalciferol (VITAMIN D-1000 MAX ST) 1000 UNITS tablet, Take 1 tablet by mouth daily., Disp: , Rfl:  .  dorzolamide-timolol (COSOPT) 22.3-6.8 MG/ML ophthalmic solution, INSTILL 1 DROP INTO EACH EYE TWICE DAILY, Disp: , Rfl:  .  Dulaglutide 1.5 MG/0.5ML SOPN, Inject into the skin. Inject 1.5 mg subcutaneously every 7 days, Disp: , Rfl:  .  ELDERBERRY PO, Take by mouth., Disp: , Rfl:  .  glucose blood (ONE TOUCH ULTRA TEST) test strip, Use 2 (two) times daily. Use as instructed., Disp: , Rfl:  .  hydrALAZINE (APRESOLINE) 25 MG tablet, Take 1 tablet by mouth 3 (three) times daily., Disp: , Rfl:  .  hydrochlorothiazide (HYDRODIURIL) 25 MG tablet, TAKE 1 TABLET BY MOUTH ONCE DAILY, Disp: , Rfl:  .  ibuprofen (ADVIL,MOTRIN) 800 MG tablet, Take 1 tablet (800 mg total) by mouth every 8 (eight) hours as needed., Disp: 30 tablet, Rfl: 0 .  Insulin Glargine, 2 Unit Dial, 300 UNIT/ML SOPN, Inject  into the skin. Currently 32 units daily, Disp: , Rfl:  .  lisinopril (PRINIVIL,ZESTRIL) 40 MG tablet, TAKE 1 TABLET BY MOUTH ONCE DAILY, Disp: , Rfl:  .  Multiple Vitamin (MULTI-VITAMINS) TABS, Take 1 tablet by mouth daily., Disp: , Rfl:  .  glimepiride (AMARYL) 2 MG tablet, TAKE 1 TABLET BY MOUTH ONCE DAILY WITH BREAKFAST, Disp: , Rfl:  .  polyethylene glycol-electrolytes (NULYTELY) 420 g solution, , Disp: , Rfl:  .  rosuvastatin (CRESTOR) 20 MG tablet, Take 20 mg by mouth at bedtime., Disp: , Rfl:     Family History  Problem Relation Age of Onset  . Cancer Mother        colon  . Cancer Father        lung  . Cirrhosis Father   . Diabetes Maternal Grandmother   . Breast cancer Neg Hx      Social History   Tobacco Use  . Smoking status: Never Smoker  . Smokeless tobacco: Never Used  Substance Use Topics  . Alcohol use: No  . Drug use: No  Allergies as of 10/14/2019  . (No Known Allergies)     Imaging Studies: Reviewed  Assessment and Plan:   YENA TISBY is a 59 y.o. female with metabolic syndrome, insulin-dependent type 2 diabetes, stage IV CKD is seen in consultation for normocytic anemia.  Patient was initially evaluated by hematologist, Dr. Tasia Catchings, anemia felt to be combination of anemia of chronic disease and iron deficiency.  Patient therefore received parenteral iron as well as Procrit she underwent EGD and colonoscopy which were unremarkable except for the presence of tubular adenoma ascending colon.  Her iron studies from 06/2019 did not reveal iron deficiency.  Recommend to recheck CBC, iron studies today.  If there is evidence of ferritin level below 100, recommend video capsule endoscopy and discussed the same with patient.  Patient will go to nearby [facility] for labs  History of tubular adenoma of ascending colon Recommend repeat colonoscopy with 2-day prep in 1 year as the bowel prep was fair in 09/2019   Follow Up Instructions:   I discussed the  assessment and treatment plan with the patient. The patient was provided an opportunity to ask questions and all were answered. The patient agreed with the plan and demonstrated an understanding of the instructions.   The patient was advised to call back or seek an in-person evaluation if the symptoms worsen or if the condition fails to improve as anticipated.  I provided 15 minutes of face-to-face time during this encounter.   Follow up as needed   Cephas Darby, MD

## 2019-10-15 ENCOUNTER — Ambulatory Visit: Payer: Commercial Managed Care - PPO | Admitting: Gastroenterology

## 2019-10-20 ENCOUNTER — Telehealth: Payer: Self-pay

## 2019-10-20 NOTE — Telephone Encounter (Signed)
Patient called back and verbalized understanding.

## 2019-10-20 NOTE — Telephone Encounter (Signed)
1. Gastric bx - normal  2.colon polyp - adenoma  3. Poor prep colon repeat in 6 months  4. F/u with Dr Bonna Gains to complete anemia evaluation   Kiran  Called and left a message for call back

## 2019-11-15 IMAGING — MG DIGITAL SCREENING BILATERAL MAMMOGRAM WITH TOMO AND CAD
6 of 12 series · 6 of 36 positions shown · non-contrast
Comparison: Previous exam(s).

CLINICAL DATA: Screening.

EXAM:
DIGITAL SCREENING BILATERAL MAMMOGRAM WITH TOMO AND CAD

[L MLO synth-2D (1 of 2)]
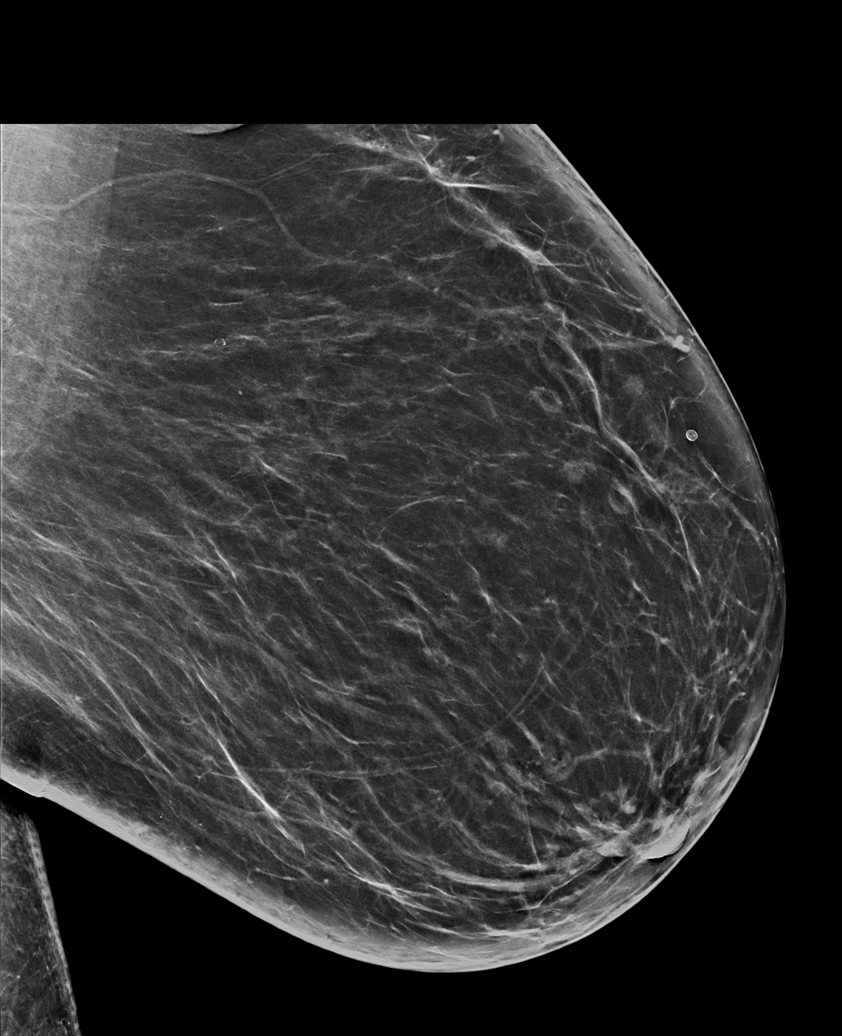

[R MLO synth-2D (1 of 2)]
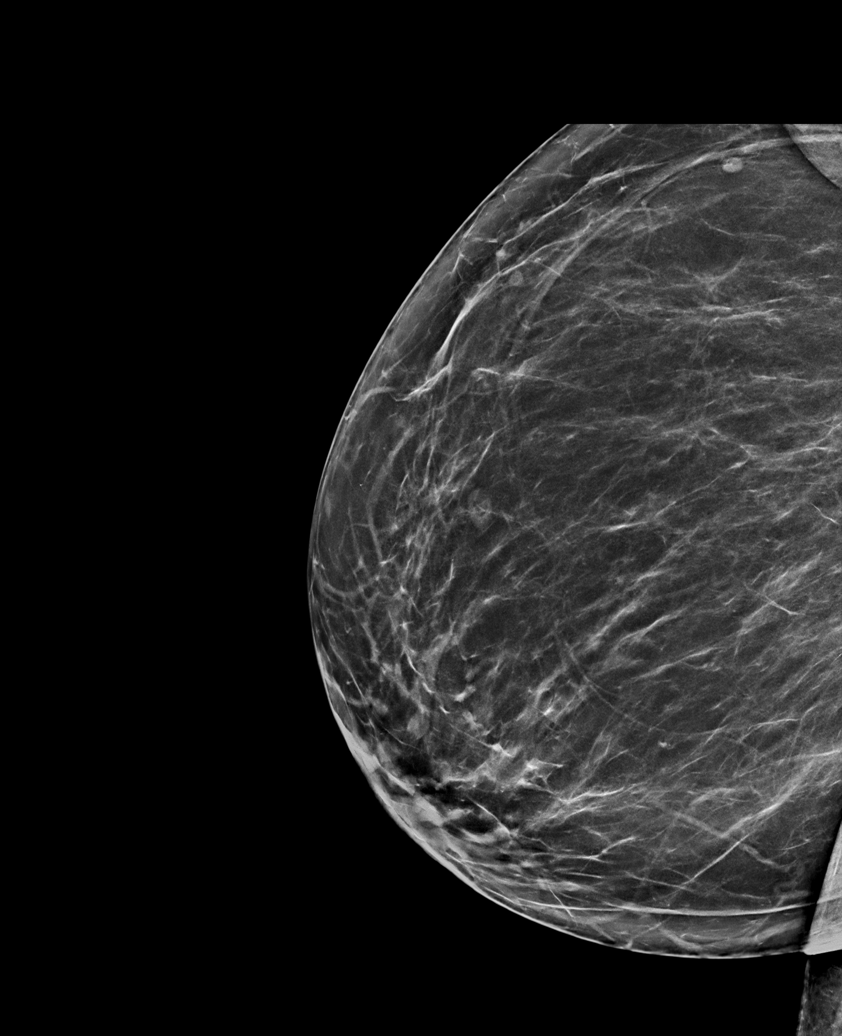

[L CC synth-2D]
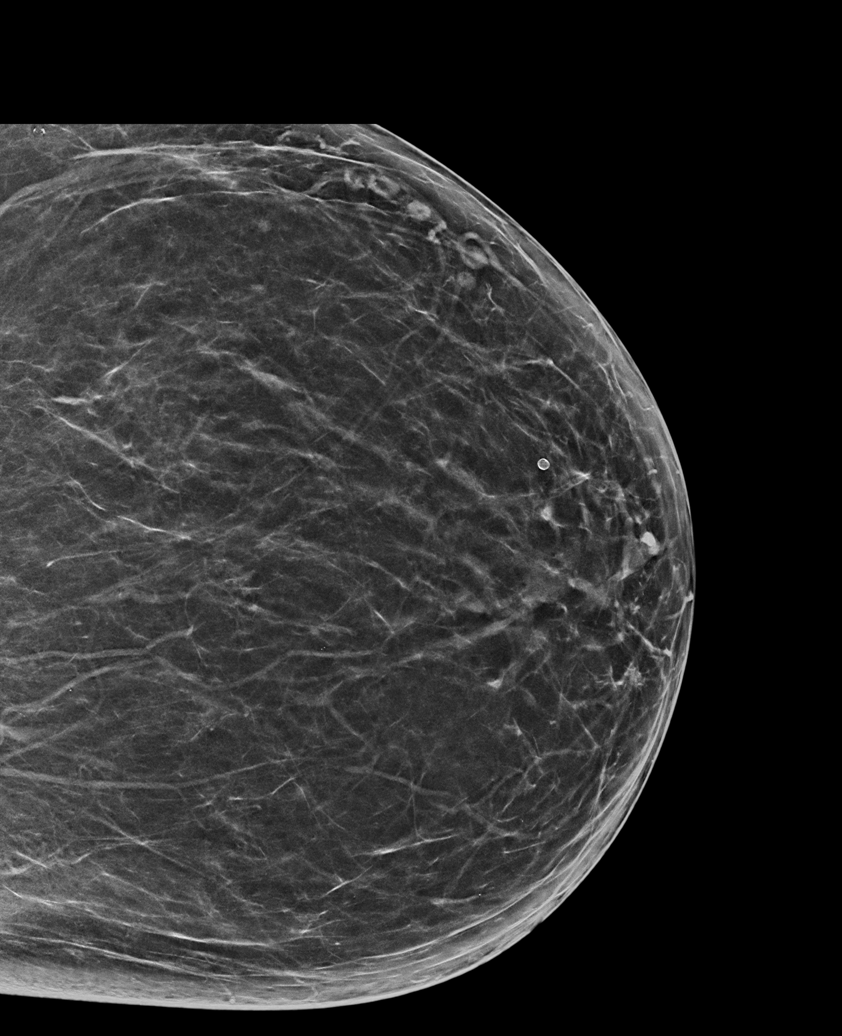

[R MLO synth-2D (2 of 2)]
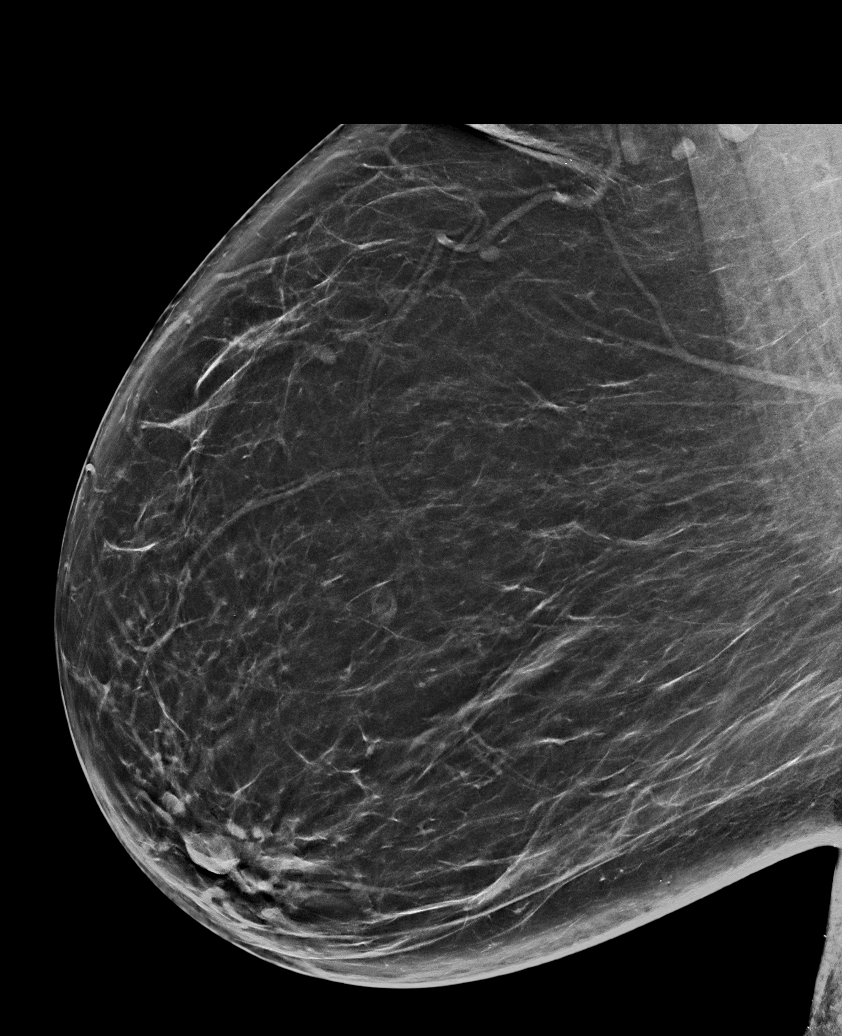

[L MLO synth-2D (2 of 2)]
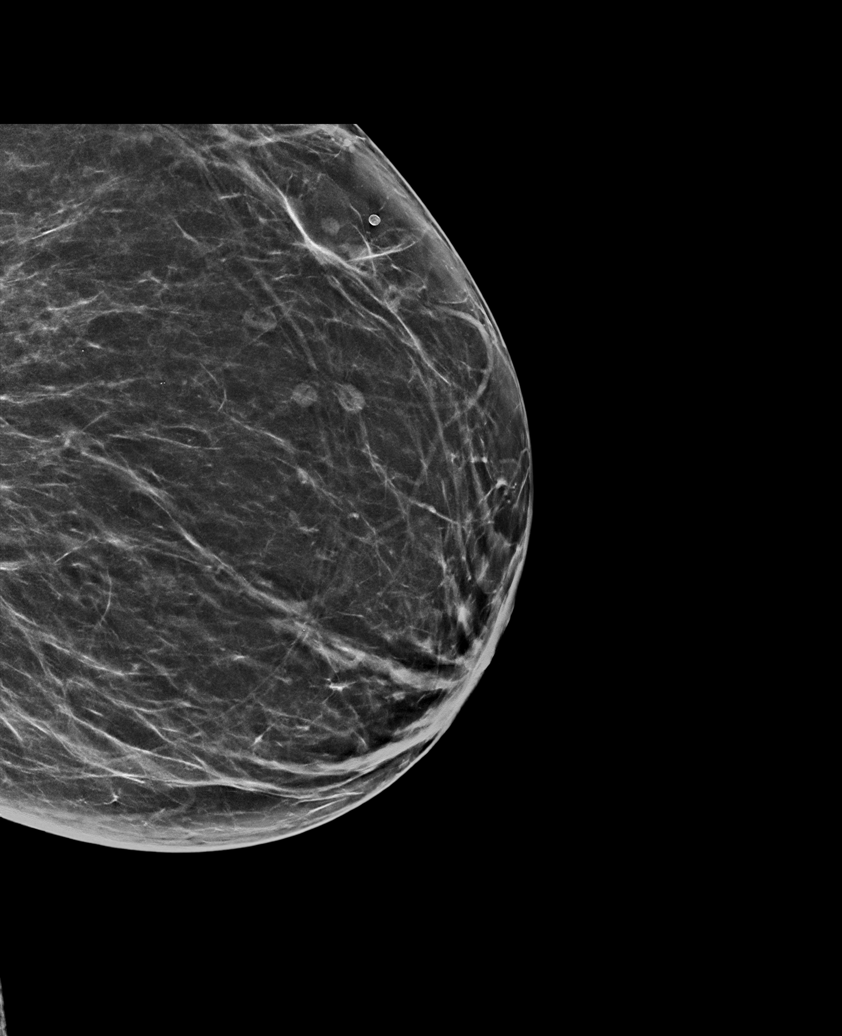

[R CC synth-2D]
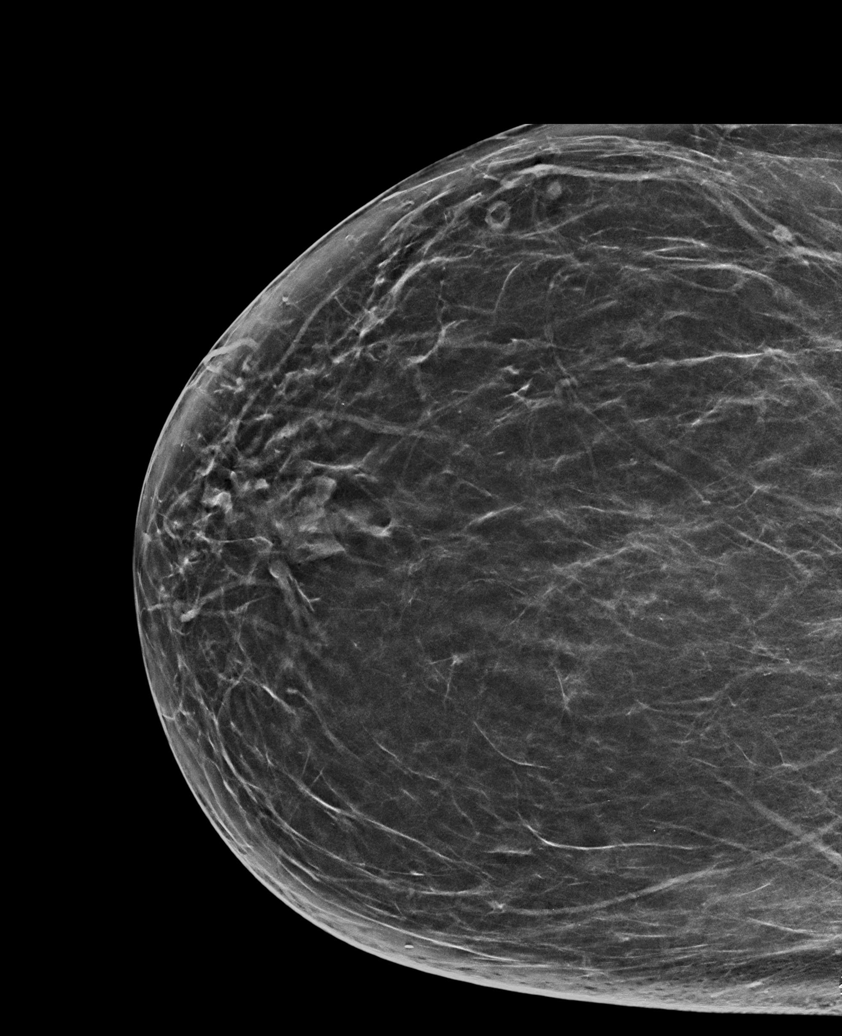

[6 of 36 positions shown; findings below may reference images not displayed]

ACR Breast Density Category b: There are scattered areas of
fibroglandular density.
FINDINGS: There are no findings suspicious for malignancy. Images were
processed with CAD.
IMPRESSION: No mammographic evidence of malignancy. A result letter of this
screening mammogram will be mailed directly to the patient.

RECOMMENDATION:
Screening mammogram in one year. (Code:[id number])

BI-RADS CATEGORY  1: Negative.

## 2019-12-02 ENCOUNTER — Telehealth: Payer: Self-pay

## 2019-12-02 NOTE — Telephone Encounter (Signed)
-----   Message from Maryann Conners, [place] sent at 11/28/2019  9:48 AM EST ----- Regarding: RE: Established Pt Appt Request FYI....   I've tried calling pt x4 I have been unable to reach her due to her vmail being full and not allowing me to leave her a detailed message. However, I was able speak Husband Orpah Greek, He stated that he will let her know and have her to give the office a call back soon.   Ellison Hughs ----- Message ----- From: Earlie Server, MD Sent: 11/27/2019   1:50 PM EST To: Maryann Conners, NT, Evelina Dun, RN, # Subject: RE: Established Pt Appt Request                Please schedule. Looks like she lost follow up. Suppose to have a 3 months follow up by now.  Lab cbc iron panel, MD in person, +/- Venofer. Thanks.  ----- Message ----- From: Maryann Conners, NT Sent: 11/27/2019  11:39 AM EST To: Maryann Conners, NT, Evelina Dun, RN, # Subject: Melton Alar: Established Pt Appt Request                Dr. Tasia Catchings  Please Advise   [place] Chard ----- Message ----- From: Bryson Corona D Sent: 11/27/2019  11:25 AM EST To: Maryann Conners, NT Subject: Established Pt Appt Request                    Hello,  I received a referral from [place] for this pt who appears to be an established pt of Dr. Collie Siad. Can you please take a look at this referral and call pt to set up an appt? Thanks.

## 2019-12-12 ENCOUNTER — Telehealth: Payer: Self-pay

## 2019-12-12 ENCOUNTER — Inpatient Hospital Stay: Payer: Commercial Managed Care - PPO | Admitting: Oncology

## 2019-12-12 ENCOUNTER — Inpatient Hospital Stay: Payer: Commercial Managed Care - PPO

## 2019-12-12 NOTE — Telephone Encounter (Signed)
-----   Message from [place], [place] sent at 12/12/2019 11:28 AM EDT ----- Regarding: Request for [facility] slip Pt called to cancel and R/S her appts for today And wants to  have her labs drawn at [facility] and is requesting to have Order slips mailed to her.  Thanks   [place]

## 2019-12-12 NOTE — Telephone Encounter (Signed)
[facility] test order form and appt reminders mailed to patient.

## 2019-12-25 ENCOUNTER — Telehealth: Payer: Self-pay | Admitting: *Deleted

## 2019-12-25 NOTE — Telephone Encounter (Signed)
12/26/19 MD/+/- Venofer appts were cancelled due to pt not having her [facility] labs drawn prior.. Message left on her vmail to contact office to R/S.Marland Kitchen

## 2019-12-25 NOTE — Telephone Encounter (Signed)
Contacted [facility] and there are no recent lab results for this patient. Last results are from January.

## 2019-12-26 ENCOUNTER — Inpatient Hospital Stay: Payer: Commercial Managed Care - PPO | Admitting: Oncology

## 2019-12-26 ENCOUNTER — Inpatient Hospital Stay: Payer: Commercial Managed Care - PPO

## 2020-01-16 ENCOUNTER — Telehealth: Payer: Self-pay

## 2020-01-16 NOTE — Telephone Encounter (Signed)
Patient gets labs drawn at [facility].  We have not received recent lab results so I called [facility] to see if recent results available.  But, the last labs they have are from 09/2019.  Will get the appt for Monday for MD/poss Venofer r/s.

## 2020-01-16 NOTE — Telephone Encounter (Signed)
Just spoke with pt and she stated that she just had her labs drawn today and wants to keep her 01/19/20 appts as scheduled.

## 2020-01-16 NOTE — Telephone Encounter (Signed)
That sounds good. Thank you. 

## 2020-01-19 ENCOUNTER — Other Ambulatory Visit: Payer: Self-pay

## 2020-01-19 ENCOUNTER — Encounter: Payer: Self-pay | Admitting: Oncology

## 2020-01-19 ENCOUNTER — Inpatient Hospital Stay: Payer: Commercial Managed Care - PPO

## 2020-01-19 ENCOUNTER — Inpatient Hospital Stay: Payer: Commercial Managed Care - PPO | Attending: Oncology | Admitting: Oncology

## 2020-01-19 VITALS — BP 183/76 | HR 69 | Temp 98.3°F | Resp 16 | Wt 200.8 lb

## 2020-01-19 DIAGNOSIS — N184 Chronic kidney disease, stage 4 (severe): Secondary | ICD-10-CM | POA: Diagnosis not present

## 2020-01-19 DIAGNOSIS — I10 Essential (primary) hypertension: Secondary | ICD-10-CM

## 2020-01-19 DIAGNOSIS — R5383 Other fatigue: Secondary | ICD-10-CM | POA: Insufficient documentation

## 2020-01-19 DIAGNOSIS — G4730 Sleep apnea, unspecified: Secondary | ICD-10-CM | POA: Diagnosis not present

## 2020-01-19 DIAGNOSIS — I129 Hypertensive chronic kidney disease with stage 1 through stage 4 chronic kidney disease, or unspecified chronic kidney disease: Secondary | ICD-10-CM | POA: Diagnosis present

## 2020-01-19 DIAGNOSIS — Z800 Family history of malignant neoplasm of digestive organs: Secondary | ICD-10-CM | POA: Diagnosis not present

## 2020-01-19 DIAGNOSIS — E7800 Pure hypercholesterolemia, unspecified: Secondary | ICD-10-CM | POA: Diagnosis not present

## 2020-01-19 DIAGNOSIS — D631 Anemia in chronic kidney disease: Secondary | ICD-10-CM | POA: Diagnosis not present

## 2020-01-19 DIAGNOSIS — Z79899 Other long term (current) drug therapy: Secondary | ICD-10-CM | POA: Insufficient documentation

## 2020-01-19 DIAGNOSIS — Z791 Long term (current) use of non-steroidal anti-inflammatories (NSAID): Secondary | ICD-10-CM | POA: Diagnosis not present

## 2020-01-19 NOTE — Progress Notes (Signed)
Patient does not offer any problems today.  

## 2020-01-19 NOTE — Progress Notes (Signed)
Hematology/Oncology follow up [facility] [phone] Fax:([phone]   Patient Care Team: [name], NP as PCP - General (Internal Medicine)  REFERRING PROVIDER: Dr.Lateef CHIEF COMPLAINTS/REASON FOR VISIT:  Follow-up for anemia  HISTORY OF PRESENTING ILLNESS:  Erin Crawford is a  59 y.o.  female with PMH listed below who was referred to me for evaluation of anemia Reviewed patient's recent labs.  Labs revealed anemia with hemoglobin of 9.6, MCV 82, wbc 8.8, platelet  331,000.   Cr 1.81, eGFR 36. Bilirubin <0.2,  Reviewed patient's previous labs ordered by primary care physician's office, anemia is chronic onset , duration is since 2014.  No aggravating or improving factors.  Associated signs and symptoms: Patient reports fatigue. Denies SOB with exertion.  Denies weight loss, easy bruising, hematochezia, hemoptysis, hematuria. Context: History of GI bleeding: denies               History of Chronic kidney disease: stage III CKD               History of autoimmune disease: denies                Feels cold all the time. Fatigue: reports worsening fatigue. Chronic onset, perisistent, no aggravating or improving factors, no associated symptoms.    INTERVAL HISTORY Erin Crawford is a 59 y.o. female who has above history reviewed by me today presents for follow up visit for management of anemia of chronic kidney disease.  Problems and complaints are listed below: Patient was referred back by [facility] kidney clinic for anemia in chronic kidney disease. Patient was seen by me last in October 2020. Supposed to follow-up in January. Today patient reports feeling tired. She takes care of her mom whose health condition is not good.  Denies any bleeding events, chest pain, nausea or vomiting, unintentional weight loss.  Review of Systems  Constitutional: Positive for fatigue. Negative for appetite change, chills and  fever.  HENT:   Negative for hearing loss and voice change.   Eyes: Negative for eye problems.  Respiratory: Negative for chest tightness and cough.   Cardiovascular: Negative for chest pain.  Gastrointestinal: Negative for abdominal distention, abdominal pain and blood in stool.  Endocrine: Negative for hot flashes.  Genitourinary: Negative for difficulty urinating and frequency.   Musculoskeletal: Negative for arthralgias.  Skin: Negative for itching and rash.  Neurological: Negative for extremity weakness.  Hematological: Negative for adenopathy.  Psychiatric/Behavioral: Negative for confusion.    MEDICAL HISTORY:  Past Medical History:  Diagnosis Date  . Anemia   . Diabetes mellitus without complication ([place])   . Hypercholesteremia   . Hypertension   . LGSIL (low grade squamous intraepithelial dysplasia)   . Menopause   . Sleep apnea    CPAP - (uses sporadically)  . Wears dentures    partial lower    SURGICAL HISTORY: Past Surgical History:  Procedure Laterality Date  . COLONOSCOPY WITH PROPOFOL N/A 10/07/2019   Procedure: COLONOSCOPY WITH BIOPSY;  Surgeon: Virgel Manifold, MD;  Location: [place];  Service: Endoscopy;  Laterality: N/A;  Diabetic - insulin sleep apnea  . COLPOSCOPY    . ESOPHAGOGASTRODUODENOSCOPY (EGD) WITH PROPOFOL N/A 10/07/2019   Procedure: ESOPHAGOGASTRODUODENOSCOPY (EGD) WITH BIOPSY;  Surgeon: Virgel Manifold, MD;  Location: [place];  Service: Endoscopy;  Laterality: N/A;  . POLYPECTOMY N/A 10/07/2019   Procedure: POLYPECTOMY;  Surgeon: Virgel Manifold, MD;  Location: [place];  Service:  Endoscopy;  Laterality: N/A;  . TUBAL LIGATION      SOCIAL HISTORY: Social History   Socioeconomic History  . Marital status: Married    Spouse name: Not on file  . Number of children: Not on file  . Years of education: Not on file  . Highest education level: Not on file  Occupational History  . Not on file    Tobacco Use  . Smoking status: Never Smoker  . Smokeless tobacco: Never Used  Substance and Sexual Activity  . Alcohol use: No  . Drug use: No  . Sexual activity: Yes    Birth control/protection: None  Other Topics Concern  . Not on file  Social History Narrative  . Not on file   Social Determinants of Health   Financial Resource Strain:   . Difficulty of Paying Living Expenses:   Food Insecurity:   . Worried About Charity fundraiser in the Last Year:   . Arboriculturist in the Last Year:   Transportation Needs:   . Film/video editor (Medical):   Marland Kitchen Lack of Transportation (Non-Medical):   Physical Activity:   . Days of Exercise per Week:   . Minutes of Exercise per Session:   Stress:   . Feeling of Stress :   Social Connections:   . Frequency of Communication with Friends and Family:   . Frequency of Social Gatherings with Friends and Family:   . Attends Religious Services:   . Active Member of Clubs or Organizations:   . Attends Archivist Meetings:   Marland Kitchen Marital Status:   Intimate Partner Violence:   . Fear of Current or Ex-Partner:   . Emotionally Abused:   Marland Kitchen Physically Abused:   . Sexually Abused:     FAMILY HISTORY: Family History  Problem Relation Age of Onset  . Cancer Mother        colon  . Cancer Father        lung  . Cirrhosis Father   . Diabetes Maternal Grandmother   . Breast cancer Neg Hx     ALLERGIES:  has No Known Allergies.  MEDICATIONS:  Current Outpatient Medications  Medication Sig Dispense Refill  . amLODipine (NORVASC) 10 MG tablet TAKE 1 TABLET BY MOUTH ONCE DAILY    . Ascorbic Acid 500 MG CAPS Take by mouth.    Marland Kitchen atorvastatin (LIPITOR) 20 MG tablet Take 20 mg by mouth daily at 6 PM.    . carvedilol (COREG) 6.25 MG tablet Take 6.25 mg by mouth 2 (two) times daily.    . Cholecalciferol (VITAMIN D-1000 MAX ST) 1000 UNITS tablet Take 1 tablet by mouth daily.    . dorzolamide-timolol (COSOPT) 22.3-6.8 MG/ML ophthalmic  solution INSTILL 1 DROP INTO EACH EYE TWICE DAILY    . ELDERBERRY PO Take by mouth.    Marland Kitchen glucose blood (ONE TOUCH ULTRA TEST) test strip Use 2 (two) times daily. Use as instructed.    . hydrALAZINE (APRESOLINE) 25 MG tablet Take 1 tablet by mouth 3 (three) times daily.    . hydrochlorothiazide (HYDRODIURIL) 25 MG tablet TAKE 1 TABLET BY MOUTH ONCE DAILY    . ibuprofen (ADVIL,MOTRIN) 800 MG tablet Take 1 tablet (800 mg total) by mouth every 8 (eight) hours as needed. 30 tablet 0  . Insulin Glargine, 2 Unit Dial, 300 UNIT/ML SOPN Inject into the skin. Currently 32 units daily    . lisinopril (PRINIVIL,ZESTRIL) 40 MG tablet TAKE 1 TABLET BY MOUTH ONCE  DAILY    . Multiple Vitamin (MULTI-VITAMINS) TABS Take 1 tablet by mouth daily.    . sodium bicarbonate 650 MG tablet Take by mouth.    . sodium zirconium cyclosilicate (LOKELMA) 10 g PACK packet Take by mouth.    . Dulaglutide 1.5 MG/0.5ML SOPN Inject into the skin. Inject 1.5 mg subcutaneously every 7 days    . glimepiride (AMARYL) 2 MG tablet TAKE 1 TABLET BY MOUTH ONCE DAILY WITH BREAKFAST    . polyethylene glycol-electrolytes (NULYTELY) 420 g solution     . rosuvastatin (CRESTOR) 20 MG tablet Take 20 mg by mouth at bedtime.     No current facility-administered medications for this visit.     PHYSICAL EXAMINATION: ECOG PERFORMANCE STATUS: 1 - Symptomatic but completely ambulatory Vitals:   01/19/20 1405  BP: (!) 183/76  Pulse: 69  Resp: 16  Temp: 98.3 F (36.8 C)   Filed Weights   01/19/20 1405  Weight: 200 lb 12.8 oz (91.1 kg)    Physical Exam Constitutional:      General: She is not in acute distress.    Appearance: She is obese.  HENT:     Head: Normocephalic and atraumatic.  Eyes:     General: No scleral icterus.    Pupils: Pupils are equal, round, and reactive to light.  Cardiovascular:     Rate and Rhythm: Normal rate and regular rhythm.     Heart sounds: Normal heart sounds.  Pulmonary:     Effort: Pulmonary  effort is normal. No respiratory distress.     Breath sounds: No wheezing.  Abdominal:     General: Bowel sounds are normal. There is no distension.     Palpations: Abdomen is soft. There is no mass.     Tenderness: There is no abdominal tenderness.  Musculoskeletal:        General: No deformity. Normal range of motion.     Cervical back: Normal range of motion and neck supple.  Skin:    General: Skin is warm and dry.     Findings: No erythema or rash.  Neurological:     Mental Status: She is alert and oriented to person, place, and time. Mental status is at baseline.     Cranial Nerves: No cranial nerve deficit.     Coordination: Coordination normal.  Psychiatric:        Mood and Affect: Mood normal.      LABORATORY DATA:  I have reviewed the data as listed Lab Results  Component Value Date   WBC 7.3 02/08/2015   HGB 11.0 (L) 02/08/2015   HCT 33.5 (L) 02/08/2015   MCV 81.7 02/08/2015   PLT 277 02/08/2015   No results for input(s): NA, K, CL, CO2, GLUCOSE, BUN, CREATININE, CALCIUM, GFRNONAA, GFRAA, PROT, ALBUMIN, AST, ALT, ALKPHOS, BILITOT, BILIDIR, IBILI in the last 8760 hours. Iron/TIBC/Ferritin/ %Sat No results found for: IRON, TIBC, FERRITIN, IRONPCTSAT   [facility] labs  01/16/2019, she has hemoglobin 8.6, WBC 7.4, platelet 288, MCV 87.  TIBC 14, ferritin 371.   04/15/2019 CBC showed WBC 8.9, hemoglobin 10.3, hematocrit 32.1.  MCV 84, platelet 322 Ferritin 232, iron saturation 17 Creatinine 2.5  RADIOGRAPHIC STUDIES: I have personally reviewed the radiological images as listed and agreed with the findings in the report. 12/17/2017 screening mammogram bilateral:  No mammographic findings suspicious for malignancy.   ASSESSMENT & PLAN:  1. Anemia due to stage 4 chronic kidney disease ([place])   2. Family history of colon cancer in mother  3. Uncontrolled hypertension    #[facility] 01/16/2020 Only CBC was checked which showed hemoglobin of 8.6.  No iron panel was  checked. Her blood pressure is high today.  Patient reports that her blood pressure is usually better at home. I recommend patient to proceed with iron panel to see if she would benefit from IV iron treatments. Blood pressure is high, hemoglobin less than 10.  Hold off erythropoietin replacement therapy due to uncontrolled high blood pressure. Patient will be rescheduled for an appointment to check her blood pressure and proceed with Retacrit.  #Uncontrolled hypertension, BP is high in the clinic today.  She is asymptomatic advised patient to recheck her blood pressure at home if persistently high she needs to call her primary care provider for adjustment of blood pressure medication. # family history of colon cancer, Cancer appropriate screening: recommend colonoscopy.  Upper endoscopy colonoscopy were done on 10/07/2019 12 mm polyp in ascending colon which was resected and retrieved.  EGD showed gastric mucosal atrophy.  Mucosal changes in duodenum.  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  Negative for high-grade dysplasia and malignancy. 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  Stomach atrophic mucosa biopsy showed no dysplasia or malignancy.  We will schedule patient to have H&H in 4 weeks and 8 weeks +/-Retacrit treatments. All questions were answered. The patient knows to call the clinic with any problems questions or concerns.  Return of visit: 3 months.   Earlie Server, MD, PhD Hematology Oncology [place] [facility] 01/19/2020

## 2020-01-23 ENCOUNTER — Inpatient Hospital Stay: Payer: Commercial Managed Care - PPO

## 2020-01-23 ENCOUNTER — Other Ambulatory Visit: Payer: Self-pay

## 2020-01-23 VITALS — BP 160/64 | HR 73

## 2020-01-23 DIAGNOSIS — I129 Hypertensive chronic kidney disease with stage 1 through stage 4 chronic kidney disease, or unspecified chronic kidney disease: Secondary | ICD-10-CM | POA: Diagnosis not present

## 2020-01-23 DIAGNOSIS — D631 Anemia in chronic kidney disease: Secondary | ICD-10-CM

## 2020-01-23 MED ORDER — EPOETIN ALFA-EPBX 10000 UNIT/ML IJ SOLN
20000.0000 [IU] | Freq: Once | INTRAMUSCULAR | Status: AC
  Administered 2020-01-23: 20000 [IU] via SUBCUTANEOUS
  Filled 2020-01-23: qty 2

## 2020-02-20 ENCOUNTER — Other Ambulatory Visit: Payer: Self-pay

## 2020-02-20 ENCOUNTER — Telehealth: Payer: Self-pay | Admitting: *Deleted

## 2020-02-20 ENCOUNTER — Inpatient Hospital Stay: Payer: Commercial Managed Care - PPO

## 2020-02-20 ENCOUNTER — Inpatient Hospital Stay: Payer: Commercial Managed Care - PPO | Attending: Oncology

## 2020-02-20 VITALS — BP 154/85 | HR 76

## 2020-02-20 DIAGNOSIS — D631 Anemia in chronic kidney disease: Secondary | ICD-10-CM

## 2020-02-20 DIAGNOSIS — I129 Hypertensive chronic kidney disease with stage 1 through stage 4 chronic kidney disease, or unspecified chronic kidney disease: Secondary | ICD-10-CM | POA: Insufficient documentation

## 2020-02-20 DIAGNOSIS — N189 Chronic kidney disease, unspecified: Secondary | ICD-10-CM

## 2020-02-20 DIAGNOSIS — N184 Chronic kidney disease, stage 4 (severe): Secondary | ICD-10-CM | POA: Insufficient documentation

## 2020-02-20 LAB — HEMOGLOBIN AND HEMATOCRIT, BLOOD
HCT: 29.5 % — ABNORMAL LOW (ref 36.0–46.0)
Hemoglobin: 9.3 g/dL — ABNORMAL LOW (ref 12.0–15.0)

## 2020-02-20 MED ORDER — EPOETIN ALFA-EPBX 10000 UNIT/ML IJ SOLN
20000.0000 [IU] | Freq: Once | INTRAMUSCULAR | Status: AC
  Administered 2020-02-20: 20000 [IU] via SUBCUTANEOUS
  Filled 2020-02-20: qty 2

## 2020-02-20 NOTE — Telephone Encounter (Signed)
Patient asking if she can get her ;labs drawn at our office before her injection this morning She requests a return call

## 2020-02-20 NOTE — Telephone Encounter (Signed)
Pt did not get labs at [facility] prior to today's inj appt. Pt will be scheduled for labs today. Ellison Hughs to contact pt with appt details.

## 2020-03-24 ENCOUNTER — Inpatient Hospital Stay: Payer: Commercial Managed Care - PPO

## 2020-03-25 ENCOUNTER — Other Ambulatory Visit: Payer: Self-pay

## 2020-03-25 ENCOUNTER — Inpatient Hospital Stay: Payer: Commercial Managed Care - PPO | Attending: Oncology

## 2020-03-25 ENCOUNTER — Inpatient Hospital Stay: Payer: Commercial Managed Care - PPO

## 2020-03-25 DIAGNOSIS — N184 Chronic kidney disease, stage 4 (severe): Secondary | ICD-10-CM | POA: Insufficient documentation

## 2020-03-25 DIAGNOSIS — I129 Hypertensive chronic kidney disease with stage 1 through stage 4 chronic kidney disease, or unspecified chronic kidney disease: Secondary | ICD-10-CM | POA: Insufficient documentation

## 2020-03-25 DIAGNOSIS — D631 Anemia in chronic kidney disease: Secondary | ICD-10-CM | POA: Diagnosis not present

## 2020-03-25 DIAGNOSIS — N189 Chronic kidney disease, unspecified: Secondary | ICD-10-CM

## 2020-03-25 LAB — HEMOGLOBIN AND HEMATOCRIT, BLOOD
HCT: 30.6 % — ABNORMAL LOW (ref 36.0–46.0)
Hemoglobin: 9.7 g/dL — ABNORMAL LOW (ref 12.0–15.0)

## 2020-03-30 ENCOUNTER — Ambulatory Visit: Payer: Commercial Managed Care - PPO

## 2020-03-30 ENCOUNTER — Inpatient Hospital Stay: Payer: Commercial Managed Care - PPO

## 2020-03-30 ENCOUNTER — Other Ambulatory Visit: Payer: Self-pay

## 2020-03-30 VITALS — BP 160/81 | HR 71

## 2020-03-30 DIAGNOSIS — I129 Hypertensive chronic kidney disease with stage 1 through stage 4 chronic kidney disease, or unspecified chronic kidney disease: Secondary | ICD-10-CM | POA: Diagnosis not present

## 2020-03-30 DIAGNOSIS — D631 Anemia in chronic kidney disease: Secondary | ICD-10-CM

## 2020-03-30 MED ORDER — EPOETIN ALFA-EPBX 10000 UNIT/ML IJ SOLN
20000.0000 [IU] | Freq: Once | INTRAMUSCULAR | Status: AC
  Administered 2020-03-30: 20000 [IU] via SUBCUTANEOUS
  Filled 2020-03-30: qty 2

## 2020-04-16 ENCOUNTER — Ambulatory Visit: Payer: Commercial Managed Care - PPO | Admitting: Oncology

## 2020-04-16 ENCOUNTER — Ambulatory Visit: Payer: Commercial Managed Care - PPO

## 2020-04-19 ENCOUNTER — Ambulatory Visit: Payer: Commercial Managed Care - PPO | Admitting: Oncology

## 2020-04-19 ENCOUNTER — Ambulatory Visit: Payer: Commercial Managed Care - PPO

## 2020-04-19 ENCOUNTER — Other Ambulatory Visit: Payer: Self-pay

## 2020-04-19 ENCOUNTER — Inpatient Hospital Stay: Payer: Commercial Managed Care - PPO

## 2020-04-19 DIAGNOSIS — D631 Anemia in chronic kidney disease: Secondary | ICD-10-CM

## 2020-04-19 DIAGNOSIS — I129 Hypertensive chronic kidney disease with stage 1 through stage 4 chronic kidney disease, or unspecified chronic kidney disease: Secondary | ICD-10-CM | POA: Diagnosis not present

## 2020-04-19 LAB — CBC WITH DIFFERENTIAL/PLATELET
Abs Immature Granulocytes: 0.02 10*3/uL (ref 0.00–0.07)
Basophils Absolute: 0 10*3/uL (ref 0.0–0.1)
Basophils Relative: 0 %
Eosinophils Absolute: 0.2 10*3/uL (ref 0.0–0.5)
Eosinophils Relative: 3 %
HCT: 30.8 % — ABNORMAL LOW (ref 36.0–46.0)
Hemoglobin: 9.6 g/dL — ABNORMAL LOW (ref 12.0–15.0)
Immature Granulocytes: 0 %
Lymphocytes Relative: 22 %
Lymphs Abs: 1.6 10*3/uL (ref 0.7–4.0)
MCH: 26.6 pg (ref 26.0–34.0)
MCHC: 31.2 g/dL (ref 30.0–36.0)
MCV: 85.3 fL (ref 80.0–100.0)
Monocytes Absolute: 0.5 10*3/uL (ref 0.1–1.0)
Monocytes Relative: 8 %
Neutro Abs: 4.7 10*3/uL (ref 1.7–7.7)
Neutrophils Relative %: 67 %
Platelets: 300 10*3/uL (ref 150–400)
RBC: 3.61 MIL/uL — ABNORMAL LOW (ref 3.87–5.11)
RDW: 14.6 % (ref 11.5–15.5)
WBC: 7.1 10*3/uL (ref 4.0–10.5)
nRBC: 0 % (ref 0.0–0.2)

## 2020-04-19 LAB — IRON AND TIBC
Iron: 45 ug/dL (ref 28–170)
Saturation Ratios: 16 % (ref 10.4–31.8)
TIBC: 280 ug/dL (ref 250–450)
UIBC: 235 ug/dL

## 2020-04-19 LAB — FERRITIN: Ferritin: 129 ng/mL (ref 11–307)

## 2020-04-21 ENCOUNTER — Inpatient Hospital Stay: Payer: Commercial Managed Care - PPO

## 2020-04-21 ENCOUNTER — Ambulatory Visit: Payer: Commercial Managed Care - PPO

## 2020-04-23 ENCOUNTER — Telehealth: Payer: Self-pay

## 2020-04-23 ENCOUNTER — Inpatient Hospital Stay: Payer: Commercial Managed Care - PPO | Admitting: Oncology

## 2020-04-23 ENCOUNTER — Inpatient Hospital Stay: Payer: Commercial Managed Care - PPO

## 2020-04-23 NOTE — Telephone Encounter (Signed)
Message received from insurance p/a dept, Grandfather, that the appt scheduled for Retacrit on Monday needs to be rescheduled due to insurance Josem Kaufmann is still in review.    Called patient to inform her of the Retacrit inj appot needing to be r/s and she would like to r/s the MD appt as well.  Her concern with moving the appt out is with the labs.  She had labs drawn on 04/19/20 and those will not be in the 7 day time frame that is allowed for Retacrit injection.  I will discuss this with MD and our pharmacy.

## 2020-04-23 NOTE — Telephone Encounter (Signed)
Done... 04/26/20 MD/Inj appt moved to 8/4 as requested.

## 2020-04-26 ENCOUNTER — Inpatient Hospital Stay: Payer: Commercial Managed Care - PPO | Admitting: Oncology

## 2020-04-26 ENCOUNTER — Inpatient Hospital Stay: Payer: Commercial Managed Care - PPO

## 2020-04-26 NOTE — Telephone Encounter (Signed)
Retacrit is authorized.

## 2020-04-28 ENCOUNTER — Inpatient Hospital Stay: Payer: Commercial Managed Care - PPO

## 2020-04-28 ENCOUNTER — Inpatient Hospital Stay: Payer: Commercial Managed Care - PPO | Admitting: Oncology

## 2020-04-28 ENCOUNTER — Other Ambulatory Visit: Payer: Self-pay

## 2020-04-28 ENCOUNTER — Telehealth: Payer: Self-pay

## 2020-04-28 DIAGNOSIS — N189 Chronic kidney disease, unspecified: Secondary | ICD-10-CM

## 2020-04-28 NOTE — Telephone Encounter (Signed)
Patient called to reschedule her appointment for Retacrit inj today due to her home bp readings being high.  She would like to r/s to next week.

## 2020-04-28 NOTE — Telephone Encounter (Signed)
Done.. Appt for Lab/MD/RETACRIT 12 weeks after first epogen... has been R/S a requested. I was unable to reach pt by phone but a detailed message was left onpt vmail making her aware of the sched date and time of her 8/12 appt.

## 2020-05-06 ENCOUNTER — Other Ambulatory Visit: Payer: Self-pay

## 2020-05-06 ENCOUNTER — Inpatient Hospital Stay: Payer: Commercial Managed Care - PPO

## 2020-05-06 ENCOUNTER — Inpatient Hospital Stay: Payer: Commercial Managed Care - PPO | Attending: Oncology

## 2020-05-06 ENCOUNTER — Inpatient Hospital Stay (HOSPITAL_BASED_OUTPATIENT_CLINIC_OR_DEPARTMENT_OTHER): Payer: Commercial Managed Care - PPO | Admitting: Oncology

## 2020-05-06 ENCOUNTER — Encounter: Payer: Self-pay | Admitting: Oncology

## 2020-05-06 VITALS — BP 156/92 | HR 62 | Temp 96.7°F | Resp 16 | Wt 197.2 lb

## 2020-05-06 DIAGNOSIS — Z79899 Other long term (current) drug therapy: Secondary | ICD-10-CM

## 2020-05-06 DIAGNOSIS — D631 Anemia in chronic kidney disease: Secondary | ICD-10-CM

## 2020-05-06 DIAGNOSIS — E1122 Type 2 diabetes mellitus with diabetic chronic kidney disease: Secondary | ICD-10-CM | POA: Insufficient documentation

## 2020-05-06 DIAGNOSIS — G4730 Sleep apnea, unspecified: Secondary | ICD-10-CM | POA: Diagnosis not present

## 2020-05-06 DIAGNOSIS — I129 Hypertensive chronic kidney disease with stage 1 through stage 4 chronic kidney disease, or unspecified chronic kidney disease: Secondary | ICD-10-CM | POA: Insufficient documentation

## 2020-05-06 DIAGNOSIS — N184 Chronic kidney disease, stage 4 (severe): Secondary | ICD-10-CM | POA: Diagnosis not present

## 2020-05-06 DIAGNOSIS — I10 Essential (primary) hypertension: Secondary | ICD-10-CM | POA: Diagnosis not present

## 2020-05-06 DIAGNOSIS — E7800 Pure hypercholesterolemia, unspecified: Secondary | ICD-10-CM | POA: Diagnosis not present

## 2020-05-06 DIAGNOSIS — D649 Anemia, unspecified: Secondary | ICD-10-CM | POA: Diagnosis not present

## 2020-05-06 DIAGNOSIS — Z9989 Dependence on other enabling machines and devices: Secondary | ICD-10-CM | POA: Insufficient documentation

## 2020-05-06 LAB — HEMOGLOBIN: Hemoglobin: 9.2 g/dL — ABNORMAL LOW (ref 12.0–15.0)

## 2020-05-06 LAB — HEMATOCRIT: HCT: 29 % — ABNORMAL LOW (ref 36.0–46.0)

## 2020-05-06 MED ORDER — EPOETIN ALFA-EPBX 10000 UNIT/ML IJ SOLN
20000.0000 [IU] | Freq: Once | INTRAMUSCULAR | Status: AC
  Administered 2020-05-06: 20000 [IU] via SUBCUTANEOUS
  Filled 2020-05-06: qty 2

## 2020-05-06 NOTE — Addendum Note (Signed)
Addended by: Evelina Dun on: 05/06/2020 03:01 PM   Modules accepted: Orders

## 2020-05-06 NOTE — Progress Notes (Signed)
Patient denies new problems/concerns today.   °

## 2020-05-06 NOTE — Progress Notes (Signed)
Hematology/Oncology follow up [facility] [phone] Fax:(336) [phone]   Patient Care Team: [name], NP as PCP - General (Internal Medicine)  REFERRING PROVIDER: Dr.Lateef CHIEF COMPLAINTS/REASON FOR VISIT:  Follow-up for anemia  HISTORY OF PRESENTING ILLNESS:  Erin Crawford is a  59 y.o.  female with PMH listed below who was referred to me for evaluation of anemia Reviewed patient's recent labs.  Labs revealed anemia with hemoglobin of 9.6, MCV 82, wbc 8.8, platelet  331,000.   Cr 1.81, eGFR 36. Bilirubin <0.2,  Reviewed patient's previous labs ordered by primary care physician's office, anemia is chronic onset , duration is since 2014.  No aggravating or improving factors.  Associated signs and symptoms: Patient reports fatigue. Denies SOB with exertion.  Denies weight loss, easy bruising, hematochezia, hemoptysis, hematuria. Context: History of GI bleeding: denies               History of Chronic kidney disease: stage III CKD               History of autoimmune disease: denies                Feels cold all the time. Fatigue: reports worsening fatigue. Chronic onset, perisistent, no aggravating or improving factors, no associated symptoms.   # family history of colon cancer,   Upper endoscopy colonoscopy were done on 10/07/2019 12 mm polyp in ascending colon which was resected and retrieved.  EGD showed gastric mucosal atrophy.  Mucosal changes in duodenum.  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  Stomach atrophic mucosa biopsy showed no dysplasia or malignancy.  # Patient was referred back by [facility] kidney clinic for anemia in chronic kidney disease. Patient was seen by me last in October 2020.  INTERVAL HISTORY Erin Crawford is a 58 y.o. female who has above history reviewed by me  today presents for follow up visit for management of anemia of chronic kidney disease.  Problems and complaints are listed below: Patient reports feeling well today. She has restarted on CPAP, but she did not use it last night. She has been on erythropoietin therapy monthly Denies any bleeding events, chest pain, nausea or vomiting, chest pain or lower extremity swelling. Fatigue level has improved. Review of Systems  Constitutional: Negative for appetite change, chills, fatigue and fever.  HENT:   Negative for hearing loss and voice change.   Eyes: Negative for eye problems.  Respiratory: Negative for chest tightness and cough.   Cardiovascular: Negative for chest pain.  Gastrointestinal: Negative for abdominal distention, abdominal pain and blood in stool.  Endocrine: Negative for hot flashes.  Genitourinary: Negative for difficulty urinating and frequency.   Musculoskeletal: Negative for arthralgias.  Skin: Negative for itching and rash.  Neurological: Negative for extremity weakness.  Hematological: Negative for adenopathy.  Psychiatric/Behavioral: Negative for confusion.    MEDICAL HISTORY:  Past Medical History:  Diagnosis Date   Anemia    Diabetes mellitus without complication ([place])    Hypercholesteremia    Hypertension    LGSIL (low grade squamous intraepithelial dysplasia)    Menopause    Sleep apnea    CPAP - (uses sporadically)   Wears dentures    partial lower    SURGICAL HISTORY: Past Surgical History:  Procedure Laterality Date   COLONOSCOPY WITH PROPOFOL N/A 10/07/2019   Procedure: COLONOSCOPY WITH BIOPSY;  Surgeon: Bonna Gains,  Lennette Bihari, MD;  Location: [place];  Service: Endoscopy;  Laterality: N/A;  Diabetic - insulin sleep apnea   COLPOSCOPY     ESOPHAGOGASTRODUODENOSCOPY (EGD) WITH PROPOFOL N/A 10/07/2019   Procedure: ESOPHAGOGASTRODUODENOSCOPY (EGD) WITH BIOPSY;  Surgeon: Virgel Manifold, MD;  Location: [place];   Service: Endoscopy;  Laterality: N/A;   POLYPECTOMY N/A 10/07/2019   Procedure: POLYPECTOMY;  Surgeon: Virgel Manifold, MD;  Location: [place];  Service: Endoscopy;  Laterality: N/A;   TUBAL LIGATION      SOCIAL HISTORY: Social History   Socioeconomic History   Marital status: Married    Spouse name: Not on file   Number of children: Not on file   Years of education: Not on file   Highest education level: Not on file  Occupational History   Not on file  Tobacco Use   Smoking status: Never Smoker   Smokeless tobacco: Never Used  Vaping Use   Vaping Use: Never used  Substance and Sexual Activity   Alcohol use: No   Drug use: No   Sexual activity: Yes    Birth control/protection: None  Other Topics Concern   Not on file  Social History Narrative   Not on file   Social Determinants of Health   Financial Resource Strain:    Difficulty of Paying Living Expenses:   Food Insecurity:    Worried About Charity fundraiser in the Last Year:    Arboriculturist in the Last Year:   Transportation Needs:    Film/video editor (Medical):    Lack of Transportation (Non-Medical):   Physical Activity:    Days of Exercise per Week:    Minutes of Exercise per Session:   Stress:    Feeling of Stress :   Social Connections:    Frequency of Communication with Friends and Family:    Frequency of Social Gatherings with Friends and Family:    Attends Religious Services:    Active Member of Clubs or Organizations:    Attends Music therapist:    Marital Status:   Intimate Partner Violence:    Fear of Current or Ex-Partner:    Emotionally Abused:    Physically Abused:    Sexually Abused:     FAMILY HISTORY: Family History  Problem Relation Age of Onset   Cancer Mother        colon   Cancer Father        lung   Cirrhosis Father    Diabetes Maternal Grandmother    Breast cancer Neg Hx     ALLERGIES:  has  No Known Allergies.  MEDICATIONS:  Current Outpatient Medications  Medication Sig Dispense Refill   amLODipine (NORVASC) 10 MG tablet TAKE 1 TABLET BY MOUTH ONCE DAILY     Ascorbic Acid 500 MG CAPS Take by mouth.     atorvastatin (LIPITOR) 20 MG tablet Take 20 mg by mouth daily at 6 PM.     carvedilol (COREG) 6.25 MG tablet Take 6.25 mg by mouth 2 (two) times daily.     Cholecalciferol (VITAMIN D-1000 MAX ST) 1000 UNITS tablet Take 1 tablet by mouth daily.     dorzolamide-timolol (COSOPT) 22.3-6.8 MG/ML ophthalmic solution INSTILL 1 DROP INTO EACH EYE TWICE DAILY     Dulaglutide 1.5 MG/0.5ML SOPN Inject into the skin. Inject 1.5 mg subcutaneously every 7 days     ELDERBERRY PO Take by mouth.     glimepiride (AMARYL) 2  MG tablet TAKE 1 TABLET BY MOUTH ONCE DAILY WITH BREAKFAST     glucose blood (ONE TOUCH ULTRA TEST) test strip Use 2 (two) times daily. Use as instructed.     hydrALAZINE (APRESOLINE) 25 MG tablet Take 1 tablet by mouth 3 (three) times daily.     hydrochlorothiazide (HYDRODIURIL) 25 MG tablet TAKE 1 TABLET BY MOUTH ONCE DAILY     ibuprofen (ADVIL,MOTRIN) 800 MG tablet Take 1 tablet (800 mg total) by mouth every 8 (eight) hours as needed. 30 tablet 0   Insulin Glargine, 2 Unit Dial, 300 UNIT/ML SOPN Inject into the skin. Currently 32 units daily     lisinopril (PRINIVIL,ZESTRIL) 40 MG tablet TAKE 1 TABLET BY MOUTH ONCE DAILY     Multiple Vitamin (MULTI-VITAMINS) TABS Take 1 tablet by mouth daily.     polyethylene glycol-electrolytes (NULYTELY) 420 g solution      rosuvastatin (CRESTOR) 20 MG tablet Take 20 mg by mouth at bedtime.     sodium bicarbonate 650 MG tablet Take by mouth.     No current facility-administered medications for this visit.     PHYSICAL EXAMINATION: ECOG PERFORMANCE STATUS: 1 - Symptomatic but completely ambulatory Vitals:   05/06/20 0852  BP: (!) 183/79  Pulse: 62  Resp: 16  Temp: (!) 96.7 F (35.9 C)   Filed Weights    05/06/20 0852  Weight: 197 lb 3.2 oz (89.4 kg)    Physical Exam Constitutional:      General: She is not in acute distress.    Appearance: She is obese.  HENT:     Head: Normocephalic and atraumatic.  Eyes:     General: No scleral icterus.    Pupils: Pupils are equal, round, and reactive to light.  Cardiovascular:     Rate and Rhythm: Normal rate and regular rhythm.     Heart sounds: Normal heart sounds.  Pulmonary:     Effort: Pulmonary effort is normal. No respiratory distress.     Breath sounds: No wheezing.  Abdominal:     General: Bowel sounds are normal. There is no distension.     Palpations: Abdomen is soft. There is no mass.     Tenderness: There is no abdominal tenderness.  Musculoskeletal:        General: No deformity. Normal range of motion.     Cervical back: Normal range of motion and neck supple.  Skin:    General: Skin is warm and dry.     Findings: No erythema or rash.  Neurological:     Mental Status: She is alert and oriented to person, place, and time. Mental status is at baseline.     Cranial Nerves: No cranial nerve deficit.     Coordination: Coordination normal.  Psychiatric:        Mood and Affect: Mood normal.      LABORATORY DATA:  I have reviewed the data as listed Lab Results  Component Value Date   WBC 7.1 04/19/2020   HGB 9.6 (L) 04/19/2020   HCT 30.8 (L) 04/19/2020   MCV 85.3 04/19/2020   PLT 300 04/19/2020   No results for input(s): NA, K, CL, CO2, GLUCOSE, BUN, CREATININE, CALCIUM, GFRNONAA, GFRAA, PROT, ALBUMIN, AST, ALT, ALKPHOS, BILITOT, BILIDIR, IBILI in the last 8760 hours. Iron/TIBC/Ferritin/ %Sat    Component Value Date/Time   IRON 45 04/19/2020 1152   TIBC 280 04/19/2020 1152   FERRITIN 129 04/19/2020 1152   IRONPCTSAT 16 04/19/2020 1152     [facility] labs  01/16/2019, she has hemoglobin 8.6, WBC 7.4, platelet 288, MCV 87.  TIBC 14, ferritin 371.   04/15/2019 CBC showed WBC 8.9, hemoglobin 10.3, hematocrit 32.1.  MCV 84,  platelet 322 Ferritin 232, iron saturation 17 Creatinine 2.5  RADIOGRAPHIC STUDIES: I have personally reviewed the radiological images as listed and agreed with the findings in the report. 12/17/2017 screening mammogram bilateral:  No mammographic findings suspicious for malignancy.   ASSESSMENT & PLAN:  1. Anemia due to stage 4 chronic kidney disease ([place])   2. Essential hypertension   3. Erythropoietin (EPO) stimulating agent anemia management patient    #Anemia secondary to chronic kidney disease. Hemoglobin 9.2 today. Proceed with retacrit today.   # Hypertension. Initially she had high blood pressure. After sitting down for 5-10 minutes, repeat BP is 156/92  Continue follow up with PCP and neprologist  We will schedule patient to have H&H in 4 weeks and 8 weeks +/-Retacrit treatments. All questions were answered. The patient knows to call the clinic with any problems questions or concerns.  Return of visit: 3 months.   Earlie Server, MD, PhD Hematology Oncology [place] at [facility] 05/06/2020

## 2020-06-03 ENCOUNTER — Inpatient Hospital Stay: Payer: Commercial Managed Care - PPO

## 2020-06-03 ENCOUNTER — Emergency Department
Admission: EM | Admit: 2020-06-03 | Discharge: 2020-06-03 | Disposition: A | Discharge status: Home / Self Care | Payer: Commercial Managed Care - PPO | Attending: Emergency Medicine | Admitting: Emergency Medicine

## 2020-06-03 ENCOUNTER — Inpatient Hospital Stay: Payer: Commercial Managed Care - PPO | Attending: Oncology

## 2020-06-03 ENCOUNTER — Other Ambulatory Visit: Payer: Self-pay

## 2020-06-03 DIAGNOSIS — D631 Anemia in chronic kidney disease: Secondary | ICD-10-CM

## 2020-06-03 DIAGNOSIS — R519 Headache, unspecified: Secondary | ICD-10-CM | POA: Insufficient documentation

## 2020-06-03 DIAGNOSIS — I10 Essential (primary) hypertension: Secondary | ICD-10-CM | POA: Insufficient documentation

## 2020-06-03 DIAGNOSIS — D649 Anemia, unspecified: Secondary | ICD-10-CM

## 2020-06-03 DIAGNOSIS — Z5321 Procedure and treatment not carried out due to patient leaving prior to being seen by health care provider: Secondary | ICD-10-CM | POA: Diagnosis not present

## 2020-06-03 LAB — HEMOGLOBIN AND HEMATOCRIT, BLOOD
HCT: 28.9 % — ABNORMAL LOW (ref 36.0–46.0)
Hemoglobin: 9.2 g/dL — ABNORMAL LOW (ref 12.0–15.0)

## 2020-06-03 MED ORDER — CLONIDINE HCL 0.1 MG/24HR TD PTWK
0.1000 mg | MEDICATED_PATCH | TRANSDERMAL | Status: DC
  Filled 2020-06-03: qty 1

## 2020-06-03 NOTE — ED Notes (Signed)
Pt reported she was leaving to go home and take meds

## 2020-06-03 NOTE — ED Triage Notes (Signed)
Pt states she went to her routine visit for an injection at the hematologist today and her b/p was elevated so they sent her here. States she has been having some HA, states she is suppose to have on her weekly b/p patch but had taken it off last night and has not put the new one on today. Pt is in NAD.

## 2020-07-01 ENCOUNTER — Inpatient Hospital Stay: Payer: Commercial Managed Care - PPO | Attending: Oncology

## 2020-07-01 ENCOUNTER — Other Ambulatory Visit: Payer: Self-pay

## 2020-07-01 ENCOUNTER — Inpatient Hospital Stay: Payer: Commercial Managed Care - PPO

## 2020-07-01 VITALS — BP 167/69 | HR 76

## 2020-07-01 DIAGNOSIS — I129 Hypertensive chronic kidney disease with stage 1 through stage 4 chronic kidney disease, or unspecified chronic kidney disease: Secondary | ICD-10-CM | POA: Diagnosis present

## 2020-07-01 DIAGNOSIS — N184 Chronic kidney disease, stage 4 (severe): Secondary | ICD-10-CM | POA: Insufficient documentation

## 2020-07-01 DIAGNOSIS — D631 Anemia in chronic kidney disease: Secondary | ICD-10-CM | POA: Diagnosis not present

## 2020-07-01 LAB — HEMOGLOBIN AND HEMATOCRIT, BLOOD
HCT: 29.2 % — ABNORMAL LOW (ref 36.0–46.0)
Hemoglobin: 9.3 g/dL — ABNORMAL LOW (ref 12.0–15.0)

## 2020-07-01 MED ORDER — EPOETIN ALFA-EPBX 10000 UNIT/ML IJ SOLN
20000.0000 [IU] | Freq: Once | INTRAMUSCULAR | Status: AC
  Administered 2020-07-01: 20000 [IU] via SUBCUTANEOUS
  Filled 2020-07-01: qty 2

## 2020-07-29 ENCOUNTER — Other Ambulatory Visit: Payer: Self-pay | Admitting: Oncology

## 2020-07-29 ENCOUNTER — Inpatient Hospital Stay: Payer: Commercial Managed Care - PPO

## 2020-07-29 ENCOUNTER — Inpatient Hospital Stay: Payer: Commercial Managed Care - PPO | Attending: Oncology

## 2020-07-29 ENCOUNTER — Inpatient Hospital Stay: Payer: Commercial Managed Care - PPO | Admitting: Oncology

## 2020-07-29 DIAGNOSIS — N184 Chronic kidney disease, stage 4 (severe): Secondary | ICD-10-CM

## 2020-07-29 DIAGNOSIS — D631 Anemia in chronic kidney disease: Secondary | ICD-10-CM

## 2020-12-24 NOTE — Progress Notes (Signed)
Pt canceled her appointment. Office staff will reach out to reschedule.

## 2020-12-27 ENCOUNTER — Ambulatory Visit: Payer: Commercial Managed Care - PPO | Admitting: Internal Medicine

## 2021-02-04 ENCOUNTER — Other Ambulatory Visit
Admission: RE | Admit: 2021-02-04 | Discharge: 2021-02-04 | Disposition: A | Discharge status: Home / Self Care | Payer: Commercial Managed Care - PPO | Attending: Cardiology | Admitting: Cardiology

## 2021-02-04 ENCOUNTER — Ambulatory Visit (INDEPENDENT_AMBULATORY_CARE_PROVIDER_SITE_OTHER): Payer: Commercial Managed Care - PPO

## 2021-02-04 ENCOUNTER — Ambulatory Visit (INDEPENDENT_AMBULATORY_CARE_PROVIDER_SITE_OTHER): Payer: Commercial Managed Care - PPO | Admitting: Cardiology

## 2021-02-04 ENCOUNTER — Other Ambulatory Visit: Payer: Self-pay

## 2021-02-04 ENCOUNTER — Encounter: Payer: Self-pay | Admitting: Cardiology

## 2021-02-04 VITALS — BP 160/100 | HR 74 | Ht 62.0 in | Wt 195.0 lb

## 2021-02-04 DIAGNOSIS — R011 Cardiac murmur, unspecified: Secondary | ICD-10-CM

## 2021-02-04 DIAGNOSIS — E7800 Pure hypercholesterolemia, unspecified: Secondary | ICD-10-CM | POA: Diagnosis not present

## 2021-02-04 DIAGNOSIS — I10 Essential (primary) hypertension: Secondary | ICD-10-CM

## 2021-02-04 DIAGNOSIS — R002 Palpitations: Secondary | ICD-10-CM

## 2021-02-04 LAB — LIPID PANEL
Cholesterol: 258 mg/dL — ABNORMAL HIGH (ref 0–200)
HDL: 55 mg/dL (ref >40)
LDL Cholesterol: 173 mg/dL — ABNORMAL HIGH (ref 0–99)
Total CHOL/HDL Ratio: 4.7 RATIO
Triglycerides: 149 mg/dL (ref <150)
VLDL: 30 mg/dL (ref 0–40)

## 2021-02-04 MED ORDER — CARVEDILOL 12.5 MG PO TABS
12.5000 mg | ORAL_TABLET | Freq: Two times a day (BID) | ORAL | 5 refills | Status: DC
Start: 2021-02-04 — End: 2023-06-21

## 2021-02-04 NOTE — Progress Notes (Signed)
Cardiology Office Note:    Date:  02/04/2021   ID:  Erin Crawford, DOB December 07, 1960, MRN [id number]  PCP:  [name], NP   [facility] Providers Cardiologist:  Kate Sable, MD     Referring MD: Anthonette Legato, MD   Chief Complaint  Patient presents with  . NEW patient-cardiac eval for presyncope/palpitations   Erin Crawford is a 60 y.o. female who is being seen today for the evaluation of palpitations at the request of Lateef, Munsoor, MD.   History of Present Illness:    Erin Crawford is a 60 y.o. female with a hx of hypertension, hyperlipidemia, diabetes, end-stage renal disease on dialysis who presents due to palpitations and 2 episodes of falls.  Patient felt 2 months ago while going down the steps of her home.  She denies losing consciousness, unsure if her legs gave up on her.  Denies chest pain, palpitations, dizziness prior to the fall.  Had a similar episode 6 months before while walking.  Had a fall another time while at church which she attributes to wearing heels.  Has palpitations almost nightly when she goes to sleep.  Takes several medicines for blood pressure, blood pressure still elevated with systolics occasionally in the 180s to 200s.  An echocardi[facility] back in 2017 at [facility] which was normal.   Past Medical History:  Diagnosis Date  . Anemia   . Diabetes mellitus without complication ([place])   . Hypercholesteremia   . Hypertension   . LGSIL (low grade squamous intraepithelial dysplasia)   . Menopause   . Sleep apnea    CPAP - (uses sporadically)  . Wears dentures    partial lower    Past Surgical History:  Procedure Laterality Date  . COLONOSCOPY WITH PROPOFOL N/A 10/07/2019   Procedure: COLONOSCOPY WITH BIOPSY;  Surgeon: Virgel Manifold, MD;  Location: [place];  Service: Endoscopy;  Laterality: N/A;  Diabetic - insulin sleep apnea  . COLPOSCOPY    . ESOPHAGOGASTRODUODENOSCOPY (EGD) WITH PROPOFOL N/A 10/07/2019    Procedure: ESOPHAGOGASTRODUODENOSCOPY (EGD) WITH BIOPSY;  Surgeon: Virgel Manifold, MD;  Location: [place];  Service: Endoscopy;  Laterality: N/A;  . POLYPECTOMY N/A 10/07/2019   Procedure: POLYPECTOMY;  Surgeon: Virgel Manifold, MD;  Location: [place];  Service: Endoscopy;  Laterality: N/A;  . TUBAL LIGATION      Current Medications: Current Meds  Medication Sig  . amLODipine (NORVASC) 10 MG tablet TAKE 1 TABLET BY MOUTH ONCE DAILY  . atorvastatin (LIPITOR) 20 MG tablet Take 20 mg by mouth daily at 6 PM.  . calcitRIOL (ROCALTROL) 0.25 MCG capsule Take 0.25 mcg by mouth daily.  . Cholecalciferol 25 MCG (1000 UT) tablet Take 1 tablet by mouth daily.  . cloNIDine (CATAPRES - DOSED IN MG/24 HR) 0.1 mg/24hr patch Place onto the skin.  Marland Kitchen dorzolamide-timolol (COSOPT) 22.3-6.8 MG/ML ophthalmic solution INSTILL 1 DROP INTO EACH EYE TWICE DAILY  . Dulaglutide 3 MG/0.5ML SOPN Inject into the skin once a week.  . furosemide (LASIX) 80 MG tablet Take 80 mg by mouth daily.  Marland Kitchen glucose blood test strip Use 2 (two) times daily. Use as instructed.  . hydrALAZINE (APRESOLINE) 25 MG tablet Take 1 tablet by mouth 3 (three) times daily.  . insulin glargine, 2 Unit Dial, (TOUJEO MAX SOLOSTAR) 300 UNIT/ML Solostar Pen Inject 38 Units into the skin daily.  . Multiple Vitamin (MULTI-VITAMINS) TABS Take 1 tablet by mouth daily.  . rosuvastatin (CRESTOR) 20 MG tablet Take 20  mg by mouth at bedtime.  . [DISCONTINUED] carvedilol (COREG) 6.25 MG tablet Take 6.25 mg by mouth 2 (two) times daily.  . [DISCONTINUED] hydrALAZINE (APRESOLINE) 50 MG tablet Take 1 tablet by mouth 3 (three) times daily.  . [DISCONTINUED] sodium zirconium cyclosilicate (LOKELMA) 10 g PACK packet Take by mouth.     Allergies:   Patient has no known allergies.   Social History   Socioeconomic History  . Marital status: Married    Spouse name: Not on file  . Number of children: Not on file  . Years of  education: Not on file  . Highest education level: Not on file  Occupational History  . Not on file  Tobacco Use  . Smoking status: Never Smoker  . Smokeless tobacco: Never Used  Vaping Use  . Vaping Use: Never used  Substance and Sexual Activity  . Alcohol use: No  . Drug use: No  . Sexual activity: Yes    Birth control/protection: None  Other Topics Concern  . Not on file  Social History Narrative  . Not on file   Social Determinants of Health   Financial Resource Strain: Not on file  Food Insecurity: Not on file  Transportation Needs: Not on file  Physical Activity: Not on file  Stress: Not on file  Social Connections: Not on file     Family History: The patient's family history includes Cancer in her father and mother; Cirrhosis in her father; Diabetes in her maternal grandmother. There is no history of Breast cancer.  ROS:   Please see the history of present illness.     All other systems reviewed and are negative.  EKGs/Labs/Other Studies Reviewed:    The following studies were reviewed today:   EKG:  EKG is  ordered today.  The ekg ordered today demonstrates normal sinus rhythm, possible left atrial enlargement.  Recent Labs: 04/19/2020: Platelets 300 07/01/2020: Hemoglobin 9.3  Recent Lipid Panel    Component Value Date/Time   CHOL 289 (H) 08/17/2015 1030   CHOL 210 (H) 01/09/2014 1333   TRIG 209 (H) 08/17/2015 1030   TRIG 150 01/09/2014 1333   HDL 49 08/17/2015 1030   HDL 45 01/09/2014 1333   VLDL 30 01/09/2014 1333   LDLCALC 198 (H) 08/17/2015 1030   LDLCALC 135 (H) 01/09/2014 1333     Risk Assessment/Calculations:      Physical Exam:    VS:  BP (!) 160/100 (BP Location: Left Arm, Patient Position: Sitting, Cuff Size: Large)   Pulse 74   Ht 5\' 2"  (1.575 m)   Wt 195 lb (88.5 kg)   SpO2 98%   BMI 35.67 kg/m     Wt Readings from Last 3 Encounters:  02/04/21 195 lb (88.5 kg)  06/03/20 197 lb (89.4 kg)  05/06/20 197 lb 3.2 oz (89.4 kg)      GEN:  Well nourished, well developed in no acute distress HEENT: Normal NECK: No JVD; No carotid bruits LYMPHATICS: No lymphadenopathy CARDIAC: RRR, 2/6 systolic murmur. RESPIRATORY:  Clear to auscultation without rales, wheezing or rhonchi  ABDOMEN: Soft, non-tender, non-distended MUSCULOSKELETAL:  No edema; No deformity  SKIN: Warm and dry NEUROLOGIC:  Alert and oriented x 3 PSYCHIATRIC:  Normal affect   ASSESSMENT:    1. Palpitations   2. Primary hypertension   3. Pure hypercholesterolemia   4. Systolic murmur    PLAN:    In order of problems listed above:  1. Palpitations, occasional falls.  Placed 2-week cardiac monitor  to evaluate any significant arrhythmias.  Orthostatic vitals were negative for orthostasis. 2. Hypertension, BP elevated.  Increase Coreg to 12.5 mg twice daily.  Patient will call us with list and doses of BP meds.  Continue amlodipine, losartan, hydralazine as prescribed. 3. Hyperlipidemia, obtain fasting lipid profile.  Continue Lipitor 20 mg daily. 4. Systolic murmur, obtain echocardiogram to evaluate any significant valvular or structural abnormalities.  Follow-up after echo, cardiac monitor, lipid panel.     Medication Adjustments/Labs and Tests Ordered: Current medicines are reviewed at length with the patient today.  Concerns regarding medicines are outlined above.  Orders Placed This Encounter  Procedures  . Lipid panel  . LONG TERM MONITOR (3-14 DAYS)  . EKG 12-Lead  . ECHOCARDIOGRAM COMPLETE   Meds ordered this encounter  Medications  . carvedilol (COREG) 12.5 MG tablet    Sig: Take 1 tablet (12.5 mg total) by mouth 2 (two) times daily.    Dispense:  60 tablet    Refill:  5    Patient Instructions  Medication Instructions:   Your physician has recommended you make the following change in your medication:   1.  INCREASE your Coreg (Carvedilol) to 12.5 MG twice a day.  *If you need a refill on your cardiac medications before  your next appointment, please call your pharmacy*   Lab Work:  Your physician recommends that you have a FASTING lipid profile drawn today at the medical mall.  Testing/Procedures:  1.  Your physician has requested that you have an echocardiogram. Echocardiography is a painless test that uses sound waves to create images of your heart. It provides your doctor with information about the size and shape of your heart and how well your heart's chambers and valves are working. This procedure takes approximately one hour. There are no restrictions for this procedure.  2.  Your physician has recommended that you wear a Zio monitor.   This monitor is a medical device that records the heart's electrical activity. Doctors most often use these monitors to diagnose arrhythmias. Arrhythmias are problems with the speed or rhythm of the heartbeat. The monitor is a small device applied to your chest. You can wear one while you do your normal daily activities. While wearing this monitor if you have any symptoms to push the button and record what you felt. Once you have worn this monitor for the period of time provider prescribed (Usually 14 days), you will return the monitor device in the postage paid box. Once it is returned they will download the data collected and provide [place] with a report which the provider will then review and we will call you with those results. Important tips:  1. Avoid showering during the first 24 hours of wearing the monitor. 2. Avoid excessive sweating to help maximize wear time. 3. Do not submerge the device, no hot tubs, and no swimming pools. 4. Keep any lotions or oils away from the patch. 5. After 24 hours you may shower with the patch on. Take brief showers with your back facing the shower head.  6. Do not remove patch once it has been placed because that will interrupt data and decrease adhesive wear time. 7. Push the button when you have any symptoms and write down what you  were feeling. 8. Once you have completed wearing your monitor, remove and place into box which has postage paid and place in your outgoing mailbox.  9. If for some reason you have misplaced your box then call our  office and we can provide another box and/or mail it off for you.       Follow-Up: At [facility], you and your health needs are our priority.  As part of our continuing mission to provide you with exceptional heart care, we have created designated Provider Care Teams.  These Care Teams include your primary Cardiologist (physician) and Advanced Practice Providers (APPs -  Physician Assistants and Nurse Practitioners) who all work together to provide you with the care you need, when you need it.  We recommend signing up for the patient portal called "MyChart".  Sign up information is provided on this After Visit Summary.  MyChart is used to connect with patients for Virtual Visits (Telemedicine).  Patients are able to view lab/test results, encounter notes, upcoming appointments, etc.  Non-urgent messages can be sent to your provider as well.   To learn more about what you can do with MyChart, go to [email or web address].    Your next appointment:   6-8 weeks   The format for your next appointment:   In Person  Provider:   Kate Sable, MD   Other Instructions      Signed, Kate Sable, MD  02/04/2021 10:18 AM    [place]

## 2021-02-04 NOTE — Patient Instructions (Signed)
Medication Instructions:   Your physician has recommended you make the following change in your medication:   1.  INCREASE your Coreg (Carvedilol) to 12.5 MG twice a day.  *If you need a refill on your cardiac medications before your next appointment, please call your pharmacy*   Lab Work:  Your physician recommends that you have a FASTING lipid profile drawn today at the [facility] mall.  Testing/Procedures:  1.  Your physician has requested that you have an echocardiogram. Echocardiography is a painless test that uses sound waves to create images of your heart. It provides your doctor with information about the size and shape of your heart and how well your heart's chambers and valves are working. This procedure takes approximately one hour. There are no restrictions for this procedure.  2.  Your physician has recommended that you wear a Zio monitor.   This monitor is a medical device that records the heart's electrical activity. Doctors most often use these monitors to diagnose arrhythmias. Arrhythmias are problems with the speed or rhythm of the heartbeat. The monitor is a small device applied to your chest. You can wear one while you do your normal daily activities. While wearing this monitor if you have any symptoms to push the button and record what you felt. Once you have worn this monitor for the period of time provider prescribed (Usually 14 days), you will return the monitor device in the postage paid box. Once it is returned they will download the data collected and provide [place] with a report which the provider will then review and we will call you with those results. Important tips:  1. Avoid showering during the first 24 hours of wearing the monitor. 2. Avoid excessive sweating to help maximize wear time. 3. Do not submerge the device, no hot tubs, and no swimming pools. 4. Keep any lotions or oils away from the patch. 5. After 24 hours you may shower with the patch on. Take brief  showers with your back facing the shower head.  6. Do not remove patch once it has been placed because that will interrupt data and decrease adhesive wear time. 7. Push the button when you have any symptoms and write down what you were feeling. 8. Once you have completed wearing your monitor, remove and place into box which has postage paid and place in your outgoing mailbox.  9. If for some reason you have misplaced your box then call our office and we can provide another box and/or mail it off for you.       Follow-Up: At [facility], you and your health needs are our priority.  As part of our continuing mission to provide you with exceptional heart care, we have created designated Provider Care Teams.  These Care Teams include your primary Cardiologist (physician) and Advanced Practice Providers (APPs -  Physician Assistants and Nurse Practitioners) who all work together to provide you with the care you need, when you need it.  We recommend signing up for the patient portal called "MyChart".  Sign up information is provided on this After Visit Summary.  MyChart is used to connect with patients for Virtual Visits (Telemedicine).  Patients are able to view lab/test results, encounter notes, upcoming appointments, etc.  Non-urgent messages can be sent to your provider as well.   To learn more about what you can do with MyChart, go to [email or web address].    Your next appointment:   6-8 weeks   The format for your next  appointment:   In Person  Provider:   Kate Sable, MD   Other Instructions

## 2021-02-07 ENCOUNTER — Telehealth: Payer: Self-pay

## 2021-02-07 DIAGNOSIS — E1169 Type 2 diabetes mellitus with other specified complication: Secondary | ICD-10-CM

## 2021-02-07 DIAGNOSIS — E785 Hyperlipidemia, unspecified: Secondary | ICD-10-CM

## 2021-02-07 MED ORDER — ATORVASTATIN CALCIUM 40 MG PO TABS: 40.0000 mg | ORAL_TABLET | Freq: Every day | ORAL | 5 refills | Status: DC

## 2021-02-07 MED ORDER — ROSUVASTATIN CALCIUM 40 MG PO TABS: 40.0000 mg | ORAL_TABLET | Freq: Every day | ORAL | 5 refills | Status: DC

## 2021-02-07 NOTE — Telephone Encounter (Signed)
-----   Message from Kate Sable, MD sent at 02/04/2021 11:06 AM EDT ----- Lipid panel/cholesterol not controlled.  Increase Lipitor to 40 mg daily.

## 2021-02-07 NOTE — Telephone Encounter (Signed)
Called and left a VM for patient to call back for the following result note:  Lipid panel/cholesterol not controlled. Increase Lipitor to 40 mg daily.

## 2021-02-07 NOTE — Telephone Encounter (Signed)
Patient returning call.

## 2021-02-07 NOTE — Telephone Encounter (Signed)
Called patient and spoke with her, she stated that she has not been on Lipitor 20MG  for awhile, she had been put on Crestor 20MG  by her internal medicine doctor some time ago.  I then spoke with Dr. Garen Lah and he recommended that she increase her Crestor to 40 MG daily and get a repeat Lipid panel just prior to her follow up appointment with [place] at the end of June.   Spoke with patient and she verbalized understanding.

## 2021-02-16 ENCOUNTER — Other Ambulatory Visit: Payer: Self-pay | Admitting: Nurse Practitioner

## 2021-02-16 DIAGNOSIS — Z1231 Encounter for screening mammogram for malignant neoplasm of breast: Secondary | ICD-10-CM

## 2021-02-28 ENCOUNTER — Telehealth: Payer: Self-pay

## 2021-02-28 NOTE — Telephone Encounter (Signed)
Called patient to review Zio results.  Patient she was going to [place] on 06/23 to have an Echocardiogram done due to her having a kidney transplant.  Patient asked if I could cancel her Echocardiogram appt but keep her appt on 06/30 with Dr. Garen Lah.

## 2021-03-14 ENCOUNTER — Other Ambulatory Visit: Payer: Commercial Managed Care - PPO

## 2021-03-17 ENCOUNTER — Telehealth: Payer: Self-pay | Admitting: Cardiology

## 2021-03-17 NOTE — Telephone Encounter (Signed)
Patient cancelled echo and has since seen a new card at [facility].  Attempted to contact to discuss keeping or cancelling 6/30 ov   No ans no vm .

## 2021-03-24 ENCOUNTER — Ambulatory Visit (INDEPENDENT_AMBULATORY_CARE_PROVIDER_SITE_OTHER): Payer: Commercial Managed Care - PPO | Admitting: Cardiology

## 2021-03-24 ENCOUNTER — Encounter: Payer: Self-pay | Admitting: Cardiology

## 2021-03-24 ENCOUNTER — Other Ambulatory Visit: Payer: Self-pay

## 2021-03-24 VITALS — BP 110/72 | HR 76 | Ht 62.0 in | Wt 196.0 lb

## 2021-03-24 DIAGNOSIS — I10 Essential (primary) hypertension: Secondary | ICD-10-CM | POA: Diagnosis not present

## 2021-03-24 DIAGNOSIS — E7800 Pure hypercholesterolemia, unspecified: Secondary | ICD-10-CM

## 2021-03-24 DIAGNOSIS — R002 Palpitations: Secondary | ICD-10-CM

## 2021-03-24 DIAGNOSIS — R011 Cardiac murmur, unspecified: Secondary | ICD-10-CM

## 2021-03-24 NOTE — Progress Notes (Signed)
Cardiology Office Note:    Date:  03/24/2021   ID:  Erin Crawford, DOB [birth year], MRN [id number]  PCP:  [name], NP   [facility] Providers Cardiologist:  Kate Sable, MD     Referring MD: [name], *   Chief Complaint  Patient presents with   Other    6-8 week follow up. Meds reviewed verbally with patient.      History of Present Illness:    Erin Crawford is a 60 y.o. female with a hx of hypertension, hyperlipidemia, diabetes, end-stage renal disease on dialysis who presents for follow-up.  She was last seen due to palpitations and 2 episodes of falls.  Cardiac monitor was placed to evaluate any significant arrhythmias.  She is being worked up for possible renal transplant at [facility].  Echocardiogram, stress test is being planned by transplant team at [facility].  Echo was previously ordered in house, but patient canceled as she wanted to get it done at [facility] which I think is reasonable.  She feels well, has not had any additional falls, working on losing some weight.  Carvedilol was started after last visit, blood pressures have improved.   Prior notes An echocardiogram back in 2017 at [facility] which was normal.   Past Medical History:  Diagnosis Date   Anemia    Diabetes mellitus without complication (HCC)    Hypercholesteremia    Hypertension    LGSIL (low grade squamous intraepithelial dysplasia)    Menopause    Sleep apnea    CPAP - (uses sporadically)   Wears dentures    partial lower    Past Surgical History:  Procedure Laterality Date   COLONOSCOPY WITH PROPOFOL N/A 10/07/2019   Procedure: COLONOSCOPY WITH BIOPSY;  Surgeon: Virgel Manifold, MD;  Location: [place];  Service: Endoscopy;  Laterality: N/A;  Diabetic - insulin sleep apnea   COLPOSCOPY     ESOPHAGOGASTRODUODENOSCOPY (EGD) WITH PROPOFOL N/A 10/07/2019   Procedure: ESOPHAGOGASTRODUODENOSCOPY (EGD) WITH BIOPSY;  Surgeon: Virgel Manifold, MD;   Location: [place];  Service: Endoscopy;  Laterality: N/A;   POLYPECTOMY N/A 10/07/2019   Procedure: POLYPECTOMY;  Surgeon: Virgel Manifold, MD;  Location: [place];  Service: Endoscopy;  Laterality: N/A;   TUBAL LIGATION      Current Medications: Current Meds  Medication Sig   amLODipine (NORVASC) 10 MG tablet TAKE 1 TABLET BY MOUTH ONCE DAILY   calcitRIOL (ROCALTROL) 0.25 MCG capsule Take 0.25 mcg by mouth daily.   carvedilol (COREG) 12.5 MG tablet Take 1 tablet (12.5 mg total) by mouth 2 (two) times daily.   Cholecalciferol 25 MCG (1000 UT) tablet Take 1 tablet by mouth daily.   cloNIDine (CATAPRES - DOSED IN MG/24 HR) 0.1 mg/24hr patch Place onto the skin.   dorzolamide-timolol (COSOPT) 22.3-6.8 MG/ML ophthalmic solution INSTILL 1 DROP INTO EACH EYE TWICE DAILY   Dulaglutide 3 MG/0.5ML SOPN Inject into the skin once a week.   furosemide (LASIX) 80 MG tablet Take 80 mg by mouth daily.   glucose blood test strip Use 2 (two) times daily. Use as instructed.   hydrALAZINE (APRESOLINE) 25 MG tablet Take 1 tablet by mouth 3 (three) times daily.   insulin glargine, 2 Unit Dial, (TOUJEO MAX SOLOSTAR) 300 UNIT/ML Solostar Pen Inject 38 Units into the skin daily.   Multiple Vitamin (MULTI-VITAMINS) TABS Take 1 tablet by mouth daily.   rosuvastatin (CRESTOR) 40 MG tablet Take 1 tablet (40 mg total) by mouth at  bedtime.     Allergies:   Patient has no known allergies.   Social History   Socioeconomic History   Marital status: Married    Spouse name: Not on file   Number of children: Not on file   Years of education: Not on file   Highest education level: Not on file  Occupational History   Not on file  Tobacco Use   Smoking status: Never   Smokeless tobacco: Never  Vaping Use   Vaping Use: Never used  Substance and Sexual Activity   Alcohol use: No   Drug use: No   Sexual activity: Yes    Birth control/protection: None  Other Topics Concern   Not on  file  Social History Narrative   Not on file   Social Determinants of Health   Financial Resource Strain: Not on file  Food Insecurity: Not on file  Transportation Needs: Not on file  Physical Activity: Not on file  Stress: Not on file  Social Connections: Not on file     Family History: The patient's family history includes Cancer in her father and mother; Cirrhosis in her father; Diabetes in her maternal grandmother. There is no history of Breast cancer.  ROS:   Please see the history of present illness.     All other systems reviewed and are negative.  EKGs/Labs/Other Studies Reviewed:    The following studies were reviewed today:   EKG:  EKG not ordered today.    Recent Labs: 04/19/2020: Platelets 300 07/01/2020: Hemoglobin 9.3  Recent Lipid Panel    Component Value Date/Time   CHOL 258 (H) 02/04/2021 0949   CHOL 289 (H) 08/17/2015 1030   CHOL 210 (H) 01/09/2014 1333   TRIG 149 02/04/2021 0949   TRIG 150 01/09/2014 1333   HDL 55 02/04/2021 0949   HDL 49 08/17/2015 1030   HDL 45 01/09/2014 1333   CHOLHDL 4.7 02/04/2021 0949   VLDL 30 02/04/2021 0949   VLDL 30 01/09/2014 1333   LDLCALC 173 (H) 02/04/2021 0949   LDLCALC 198 (H) 08/17/2015 1030   LDLCALC 135 (H) 01/09/2014 1333     Risk Assessment/Calculations:      Physical Exam:    VS:  BP 110/72 (BP Location: Left Arm, Patient Position: Sitting, Cuff Size: Normal)   Pulse 76   Ht 5\' 2"  (1.575 m)   Wt 196 lb (88.9 kg)   SpO2 98%   BMI 35.85 kg/m     Wt Readings from Last 3 Encounters:  03/24/21 196 lb (88.9 kg)  02/04/21 195 lb (88.5 kg)  06/03/20 197 lb (89.4 kg)     GEN:  Well nourished, well developed in no acute distress HEENT: Normal NECK: No JVD; No carotid bruits LYMPHATICS: No lymphadenopathy CARDIAC: RRR, 2/6 systolic murmur. RESPIRATORY:  Clear to auscultation without rales, wheezing or rhonchi  ABDOMEN: Soft, non-tender, non-distended MUSCULOSKELETAL:  No edema; No deformity   SKIN: Warm and dry NEUROLOGIC:  Alert and oriented x 3 PSYCHIATRIC:  Normal affect   ASSESSMENT:    1. Palpitations   2. Primary hypertension   3. Pure hypercholesterolemia   4. Systolic murmur     PLAN:    In order of problems listed above:  Palpitations, occasional falls.  Cardiac monitor with no significant arrhythmias to account for falls, occasional paroxysmal SVT noted.  Patient made aware, reassured. Hypertension, BP controlled.  Continue Coreg to 12.5 mg twice daily.   Continue amlodipine, hydralazine, clonidine as prescribed. Hyperlipidemia, continue Crestor 40  mg daily. Systolic murmur, patient plans to have echo performed at [facility].  Stress test also being planned for pretransplant work-up.  Last echocardiogram with no significant valvular or structural abnormalities, EF normal.  Follow-up as needed     Medication Adjustments/Labs and Tests Ordered: Current medicines are reviewed at length with the patient today.  Concerns regarding medicines are outlined above.  No orders of the defined types were placed in this encounter.  No orders of the defined types were placed in this encounter.   Patient Instructions  Medication Instructions:  Your physician recommends that you continue on your current medications as directed. Please refer to the Current Medication list given to you today.  *If you need a refill on your cardiac medications before your next appointment, please call your pharmacy*   Lab Work: None ordered If you have labs (blood work) drawn today and your tests are completely normal, you will receive your results only by: [place] (if you have MyChart) OR A paper copy in the mail If you have any lab test that is abnormal or we need to change your treatment, we will call you to review the results.   Testing/Procedures: None ordered   Follow-Up: At [facility], you and your health needs are our priority.  As part of our continuing mission  to provide you with exceptional heart care, we have created designated Provider Care Teams.  These Care Teams include your primary Cardiologist (physician) and Advanced Practice Providers (APPs -  Physician Assistants and Nurse Practitioners) who all work together to provide you with the care you need, when you need it.  We recommend signing up for the patient portal called "MyChart".  Sign up information is provided on this After Visit Summary.  MyChart is used to connect with patients for Virtual Visits (Telemedicine).  Patients are able to view lab/test results, encounter notes, upcoming appointments, etc.  Non-urgent messages can be sent to your provider as well.   To learn more about what you can do with MyChart, go to [email or web address].    Your next appointment:   Follow up as needed   The format for your next appointment:   In Person  Provider:   Kate Sable, MD   Other Instructions    Signed, Kate Sable, MD  03/24/2021 10:59 AM    [place]

## 2021-03-24 NOTE — Patient Instructions (Signed)

## 2021-05-16 ENCOUNTER — Ambulatory Visit
Admission: RE | Admit: 2021-05-16 | Discharge: 2021-05-16 | Disposition: A | Discharge status: Home / Self Care | Payer: Commercial Managed Care - PPO | Source: Ambulatory Visit | Attending: Nurse Practitioner | Admitting: Nurse Practitioner

## 2021-05-16 ENCOUNTER — Other Ambulatory Visit: Payer: Self-pay

## 2021-05-16 DIAGNOSIS — Z1231 Encounter for screening mammogram for malignant neoplasm of breast: Secondary | ICD-10-CM | POA: Insufficient documentation

## 2021-06-07 ENCOUNTER — Other Ambulatory Visit: Payer: Self-pay

## 2021-06-07 DIAGNOSIS — Z86010 Personal history of colonic polyps: Secondary | ICD-10-CM

## 2021-06-07 DIAGNOSIS — Z800 Family history of malignant neoplasm of digestive organs: Secondary | ICD-10-CM

## 2021-06-07 MED ORDER — NA SULFATE-K SULFATE-MG SULF 17.5-3.13-1.6 GM/177ML PO SOLN: 1.0000 | Freq: Once | ORAL | 0 refills | Status: AC

## 2021-06-07 NOTE — Progress Notes (Signed)
Gastroenterology Pre-Procedure Review  Request Date: 06/15/21 Requesting Physician: Dr. Bonna Gains  PATIENT REVIEW QUESTIONS: The patient responded to the following health history questions as indicated:    1. Are you having any GI issues? no 2. Do you have a personal history of Polyps? yes (10/07/19) 3. Do you have a family history of Colon Cancer or Polyps? yes (Mother colon cancer) 4. Diabetes Mellitus? yes (Type II) 5. Joint replacements in the past 12 months?no 6. Major health problems in the past 3 months?no 7. Any artificial heart valves, MVP, or defibrillator?no    MEDICATIONS & ALLERGIES:    Patient reports the following regarding taking any anticoagulation/antiplatelet therapy:   Plavix, Coumadin, Eliquis, Xarelto, Lovenox, Pradaxa, Brilinta, or Effient? no Aspirin? no  Patient confirms/reports the following medications:  Current Outpatient Medications  Medication Sig Dispense Refill   Na Sulfate-K Sulfate-Mg Sulf 17.5-3.13-1.6 GM/177ML SOLN Take 1 kit by mouth once for 1 dose. 354 mL 0   amLODipine (NORVASC) 10 MG tablet TAKE 1 TABLET BY MOUTH ONCE DAILY     calcitRIOL (ROCALTROL) 0.25 MCG capsule Take 0.25 mcg by mouth daily.     carvedilol (COREG) 12.5 MG tablet Take 1 tablet (12.5 mg total) by mouth 2 (two) times daily. 60 tablet 5   Cholecalciferol 25 MCG (1000 UT) tablet Take 1 tablet by mouth daily.     cloNIDine (CATAPRES - DOSED IN MG/24 HR) 0.1 mg/24hr patch Place onto the skin.     dorzolamide-timolol (COSOPT) 22.3-6.8 MG/ML ophthalmic solution INSTILL 1 DROP INTO EACH EYE TWICE DAILY     Dulaglutide 3 MG/0.5ML SOPN Inject into the skin once a week.     furosemide (LASIX) 80 MG tablet Take 80 mg by mouth daily.     glucose blood test strip Use 2 (two) times daily. Use as instructed.     hydrALAZINE (APRESOLINE) 25 MG tablet Take 1 tablet by mouth 3 (three) times daily.     insulin glargine, 2 Unit Dial, (TOUJEO MAX SOLOSTAR) 300 UNIT/ML Solostar Pen Inject 38  Units into the skin daily.     Multiple Vitamin (MULTI-VITAMINS) TABS Take 1 tablet by mouth daily.     rosuvastatin (CRESTOR) 40 MG tablet Take 1 tablet (40 mg total) by mouth at bedtime. 30 tablet 5   No current facility-administered medications for this visit.    Patient confirms/reports the following allergies:  No Known Allergies  No orders of the defined types were placed in this encounter.   AUTHORIZATION INFORMATION Primary Insurance: 1D#: Group #:  Secondary Insurance: 1D#: Group #:  SCHEDULE INFORMATION: Date: 06/15/21 Time: Location: [place]

## 2021-06-14 ENCOUNTER — Other Ambulatory Visit: Payer: Self-pay

## 2021-06-14 ENCOUNTER — Telehealth: Payer: Self-pay

## 2021-06-14 ENCOUNTER — Encounter: Payer: Self-pay | Admitting: Gastroenterology

## 2021-06-14 MED ORDER — PEG 3350-KCL-NA BICARB-NACL 420 G PO SOLR: 4000.0000 mL | Freq: Once | ORAL | 0 refills | Status: AC

## 2021-06-14 NOTE — Progress Notes (Signed)
Golytely prep has been sent to pharmacy. Patient has been informed.

## 2021-06-14 NOTE — Telephone Encounter (Signed)
Pt. Requesting a call she has a procedure tomorrow and her insurance would not pay for her prep she is requesting another prep be sent in.

## 2021-06-14 NOTE — Telephone Encounter (Signed)
New prep sent. Pt has been informed.

## 2021-06-15 ENCOUNTER — Ambulatory Visit: Payer: Commercial Managed Care - PPO | Admitting: Certified Registered"

## 2021-06-15 ENCOUNTER — Other Ambulatory Visit: Payer: Self-pay

## 2021-06-15 ENCOUNTER — Encounter: Payer: Self-pay | Admitting: Gastroenterology

## 2021-06-15 ENCOUNTER — Ambulatory Visit
Admission: RE | Admit: 2021-06-15 | Discharge: 2021-06-15 | Disposition: A | Discharge status: Home / Self Care | Payer: Commercial Managed Care - PPO | Attending: Gastroenterology | Admitting: Gastroenterology

## 2021-06-15 ENCOUNTER — Encounter
Admission: RE | Disposition: A | Discharge status: Home / Self Care | Payer: Self-pay | Source: Home / Self Care | Attending: Gastroenterology

## 2021-06-15 DIAGNOSIS — K635 Polyp of colon: Secondary | ICD-10-CM

## 2021-06-15 DIAGNOSIS — Z800 Family history of malignant neoplasm of digestive organs: Secondary | ICD-10-CM | POA: Diagnosis not present

## 2021-06-15 DIAGNOSIS — D122 Benign neoplasm of ascending colon: Secondary | ICD-10-CM | POA: Diagnosis not present

## 2021-06-15 DIAGNOSIS — D125 Benign neoplasm of sigmoid colon: Secondary | ICD-10-CM | POA: Insufficient documentation

## 2021-06-15 DIAGNOSIS — Z860100 Personal history of colon polyps, unspecified: Secondary | ICD-10-CM

## 2021-06-15 DIAGNOSIS — Z79899 Other long term (current) drug therapy: Secondary | ICD-10-CM | POA: Diagnosis not present

## 2021-06-15 DIAGNOSIS — Z794 Long term (current) use of insulin: Secondary | ICD-10-CM | POA: Diagnosis not present

## 2021-06-15 DIAGNOSIS — Z1211 Encounter for screening for malignant neoplasm of colon: Secondary | ICD-10-CM | POA: Insufficient documentation

## 2021-06-15 DIAGNOSIS — D124 Benign neoplasm of descending colon: Secondary | ICD-10-CM | POA: Diagnosis not present

## 2021-06-15 HISTORY — PX: COLONOSCOPY WITH PROPOFOL: SHX5780

## 2021-06-15 HISTORY — DX: Dependence on renal dialysis: Z99.2

## 2021-06-15 HISTORY — DX: Chronic kidney disease, unspecified: N18.9

## 2021-06-15 LAB — GLUCOSE, CAPILLARY: Glucose-Capillary: 144 mg/dL — ABNORMAL HIGH (ref 70–99)

## 2021-06-15 SURGERY — COLONOSCOPY WITH PROPOFOL
Anesthesia: General

## 2021-06-15 MED ORDER — PHENYLEPHRINE HCL (PRESSORS) 10 MG/ML IV SOLN
INTRAVENOUS | Status: AC
  Filled 2021-06-15: qty 1

## 2021-06-15 MED ORDER — DEXMEDETOMIDINE (PRECEDEX) IN NS 20 MCG/5ML (4 MCG/ML) IV SYRINGE
PREFILLED_SYRINGE | INTRAVENOUS | Status: DC | PRN
  Administered 2021-06-15 (×2): 8 ug via INTRAVENOUS

## 2021-06-15 MED ORDER — SODIUM CHLORIDE 0.9 % IV SOLN: INTRAVENOUS | Status: DC

## 2021-06-15 MED ORDER — PROPOFOL 10 MG/ML IV BOLUS
INTRAVENOUS | Status: AC
  Filled 2021-06-15: qty 20

## 2021-06-15 MED ORDER — LIDOCAINE HCL (CARDIAC) PF 100 MG/5ML IV SOSY
PREFILLED_SYRINGE | INTRAVENOUS | Status: DC | PRN
  Administered 2021-06-15: 50 mg via INTRAVENOUS

## 2021-06-15 MED ORDER — PROPOFOL 10 MG/ML IV BOLUS
INTRAVENOUS | Status: DC | PRN
  Administered 2021-06-15: 100 mg via INTRAVENOUS

## 2021-06-15 MED ORDER — PHENYLEPHRINE HCL (PRESSORS) 10 MG/ML IV SOLN
INTRAVENOUS | Status: DC | PRN
  Administered 2021-06-15: 100 ug via INTRAVENOUS

## 2021-06-15 MED ORDER — PROPOFOL 500 MG/50ML IV EMUL
INTRAVENOUS | Status: DC | PRN
  Administered 2021-06-15: 150 ug/kg/min via INTRAVENOUS

## 2021-06-15 MED ORDER — PROPOFOL 500 MG/50ML IV EMUL
INTRAVENOUS | Status: AC
  Filled 2021-06-15: qty 50

## 2021-06-15 NOTE — Op Note (Signed)
[facility] Gastroenterology Patient Name: Erin Crawford Procedure Date: 06/15/2021 8:31 AM MRN: [id number] Account #: [id number] Date of Birth: May 25, 1961 Admit Type: Outpatient Age: 60 Room: [facility] ENDO ROOM 3 Gender: Female Note Status: Finalized Instrument Name: Jasper Riling [id number] Procedure:             Colonoscopy Indications:           Screening for colorectal malignant neoplasm Providers:             Haileyann Staiger B. Bonna Gains MD, MD Referring MD:          Forest Gleason Md, MD (Referring MD) Medicines:             Monitored Anesthesia Care Complications:         No immediate complications. Procedure:             Pre-Anesthesia Assessment:                        - ASA Grade Assessment: II - A patient with mild                         systemic disease.                        - Prior to the procedure, a History and Physical was                         performed, and patient medications, allergies and                         sensitivities were reviewed. The patient's tolerance                         of previous anesthesia was reviewed.                        - The risks and benefits of the procedure and the                         sedation options and risks were discussed with the                         patient. All questions were answered and informed                         consent was obtained.                        - Patient identification and proposed procedure were                         verified prior to the procedure by the physician, the                         nurse, the anesthesiologist, the anesthetist and the                         technician. The procedure was verified in the  procedure room.                        After obtaining informed consent, the colonoscope was                         passed under direct vision. Throughout the procedure,                         the patient's blood pressure, pulse, and oxygen                          saturations were monitored continuously. The                         Colonoscope was introduced through the anus and                         advanced to the the cecum, identified by appendiceal                         orifice and ileocecal valve. The colonoscopy was                         performed with ease. The patient tolerated the                         procedure well. The quality of the bowel preparation                         was good. Findings:      The perianal and digital rectal examinations were normal.      A 4 mm polyp was found in the ascending colon. The polyp was sessile.       The polyp was removed with a cold snare. Resection and retrieval were       complete.      Three sessile polyps were found in the descending colon. The polyps were       4 to 5 mm in size. These polyps were removed with a cold snare.       Resection and retrieval were complete.      Three flat polyps were found in the sigmoid colon. The polyps were 4 to       5 mm in size. These polyps were removed with a cold snare. Resection and       retrieval were complete. Several hyperplastic appearing polyps were seen       in the sigmoid colon and a few were removed as representative samples.      The exam was otherwise without abnormality.      The rectum, sigmoid colon, descending colon, transverse colon, ascending       colon and cecum appeared normal.      The retroflexed view of the distal rectum and anal verge was normal and       showed no anal or rectal abnormalities. Impression:            - One 4 mm polyp in the ascending colon, removed with                         a cold  snare. Resected and retrieved.                        - Three 4 to 5 mm polyps in the descending colon,                         removed with a cold snare. Resected and retrieved.                        - Three 4 to 5 mm polyps in the sigmoid colon, removed                         with a cold snare. Resected and  retrieved.                        - The examination was otherwise normal.                        - The rectum, sigmoid colon, descending colon,                         transverse colon, ascending colon and cecum are normal.                        - The distal rectum and anal verge are normal on                         retroflexion view. Recommendation:        - Await pathology results.                        - Discharge patient to home (with escort).                        - Advance diet as tolerated.                        - Continue present medications.                        - Repeat colonoscopy date to be determined after                         pending pathology results are reviewed.                        - The findings and recommendations were discussed with                         the patient.                        - The findings and recommendations were discussed with                         the patient's family.                        - Return to primary care physician as previously  scheduled. Procedure Code(s):     --- Professional ---                        [phone], Colonoscopy, flexible; with removal of                         tumor(s), polyp(s), or other lesion(s) by snare                         technique Diagnosis Code(s):     --- Professional ---                        K63.5, Polyp of colon                        Z12.11, Encounter for screening for malignant neoplasm                         of colon CPT copyright 2019 American Medical Association. All rights reserved. The codes documented in this report are preliminary and upon coder review may  be revised to meet current compliance requirements.  [name], MD Margretta Sidle B. Bonna Gains MD, MD 06/15/2021 9:25:35 AM This report has been signed electronically. Number of Addenda: 0 Note Initiated On: 06/15/2021 8:31 AM Scope Withdrawal Time: 0 hours 19 minutes 18 seconds  Total Procedure  Duration: 0 hours 24 minutes 40 seconds  Estimated Blood Loss:  Estimated blood loss: none.      [facility]

## 2021-06-15 NOTE — Transfer of Care (Signed)
Immediate Anesthesia Transfer of Care Note  Patient: Erin Crawford  Procedure(s) Performed: COLONOSCOPY WITH PROPOFOL  Patient Location: PACU  Anesthesia Type:General  Level of Consciousness: drowsy  Airway & Oxygen Therapy: Patient Spontanous Breathing and Patient connected to face mask oxygen  Post-op Assessment: Report given to RN and Post -op Vital signs reviewed and stable  Post vital signs: Reviewed  Last Vitals:  Vitals Value Taken Time  BP 137/66 06/15/21 0916  Temp    Pulse 74 06/15/21 0916  Resp 16 06/15/21 0916  SpO2 100 % 06/15/21 0916  Vitals shown include unvalidated device data.  Last Pain:  Vitals:   06/15/21 0807  TempSrc: Temporal  PainSc: 0-No pain         Complications: No notable events documented.

## 2021-06-15 NOTE — Anesthesia Preprocedure Evaluation (Addendum)
Anesthesia Evaluation  Patient identified by MRN, date of birth, ID band Patient awake    History of Anesthesia Complications Negative for: history of anesthetic complications  Airway Mallampati: IV  TM Distance: >3 FB   Mouth opening: Limited Mouth Opening Comment: Small mouth Dental  (+) Missing,    Pulmonary sleep apnea and Continuous Positive Airway Pressure Ventilation ,    Pulmonary exam normal        Cardiovascular Exercise Tolerance: Good hypertension, Pt. on medications and Pt. on home beta blockers negative cardio ROS Normal cardiovascular exam     Neuro/Psych negative neurological ROS  negative psych ROS   GI/Hepatic negative GI ROS, Neg liver ROS,   Endo/Other  diabetes, Type 2, Oral Hypoglycemic Agents, Insulin DependentObesity BMI 36  Renal/GU Renal Insufficiency and DialysisRenal disease (Peritoneal dialysis)     Musculoskeletal negative musculoskeletal ROS (+)   Abdominal (+) + obese,   Peds  Hematology  (+) anemia ,   Anesthesia Other Findings   Reproductive/Obstetrics                           Anesthesia Physical  Anesthesia Plan  ASA: 3  Anesthesia Plan: General   Post-op Pain Management:    Induction: Intravenous  PONV Risk Score and Plan: 3 and TIVA, Propofol infusion and Treatment may vary due to age or medical condition  Airway Management Planned: Natural Airway and Nasal Cannula  Additional Equipment: None  Intra-op Plan:   Post-operative Plan:   Informed Consent: I have reviewed the patients History and Physical, chart, labs and discussed the procedure including the risks, benefits and alternatives for the proposed anesthesia with the patient or authorized representative who has indicated his/her understanding and acceptance.     Dental advisory given  Plan Discussed with: CRNA  Anesthesia Plan Comments:         Anesthesia Quick  Evaluation

## 2021-06-15 NOTE — H&P (Addendum)
Erin Antigua, MD [address] [place], [place], [place], [address] [id number] [place], [place], [place], [address] Phone: [phone]  Fax: [phone]  Primary Care Physician:  [name], NP   Pre-Procedure History & Physical: HPI:  Erin Crawford is a 60 y.o. female is here for a colonoscopy.   Past Medical History:  Diagnosis Date   Anemia    Chronic kidney disease    Diabetes mellitus without complication (HCC)    Hypercholesteremia    Hypertension    LGSIL (low grade squamous intraepithelial dysplasia)    Menopause    Peritoneal dialysis catheter in place [facility])    Sleep apnea    CPAP - (uses sporadically)   Wears dentures    partial lower    Past Surgical History:  Procedure Laterality Date   COLONOSCOPY WITH PROPOFOL N/A 10/07/2019   Procedure: COLONOSCOPY WITH BIOPSY;  Surgeon: Virgel Manifold, MD;  Location: [place];  Service: Endoscopy;  Laterality: N/A;  Diabetic - insulin sleep apnea   COLPOSCOPY     ESOPHAGOGASTRODUODENOSCOPY (EGD) WITH PROPOFOL N/A 10/07/2019   Procedure: ESOPHAGOGASTRODUODENOSCOPY (EGD) WITH BIOPSY;  Surgeon: Virgel Manifold, MD;  Location: [place];  Service: Endoscopy;  Laterality: N/A;   POLYPECTOMY N/A 10/07/2019   Procedure: POLYPECTOMY;  Surgeon: Virgel Manifold, MD;  Location: [place];  Service: Endoscopy;  Laterality: N/A;   TUBAL LIGATION      Prior to Admission medications   Medication Sig Start Date End Date Taking? Authorizing Provider  amLODipine (NORVASC) 10 MG tablet TAKE 1 TABLET BY MOUTH ONCE DAILY 06/29/17  Yes [provider]  calcitRIOL (ROCALTROL) 0.25 MCG capsule Take 0.25 mcg by mouth daily. 01/28/21  Yes [provider]  carvedilol (COREG) 12.5 MG tablet Take 1 tablet (12.5 mg total) by mouth 2 (two) times daily. 02/04/21  Yes Kate Sable, MD  Cholecalciferol 25 MCG (1000 UT) tablet Take 1 tablet by mouth daily.    Yes [provider]  dorzolamide-timolol (COSOPT) 22.3-6.8 MG/ML ophthalmic solution INSTILL 1 DROP INTO EACH EYE TWICE DAILY 08/31/18  Yes [provider]  Dulaglutide 3 MG/0.5ML SOPN Inject into the skin once a week. 05/12/20  Yes [provider]  furosemide (LASIX) 80 MG tablet Take 80 mg by mouth daily. 12/17/20  Yes [provider]  glucose blood test strip Use 2 (two) times daily. Use as instructed. 12/29/14  Yes [provider]  hydrALAZINE (APRESOLINE) 25 MG tablet Take 1 tablet by mouth 3 (three) times daily. 09/30/18  Yes [provider]  cloNIDine (CATAPRES - DOSED IN MG/24 HR) 0.1 mg/24hr patch Place onto the skin. 04/02/20   [provider]  insulin glargine, 2 Unit Dial, (TOUJEO MAX SOLOSTAR) 300 UNIT/ML Solostar Pen Inject 38 Units into the skin daily. 01/26/20   [provider]  Multiple Vitamin (MULTI-VITAMINS) TABS Take 1 tablet by mouth daily.    [provider]  rosuvastatin (CRESTOR) 40 MG tablet Take 1 tablet (40 mg total) by mouth at bedtime. 02/07/21   Kate Sable, MD    Allergies as of 06/07/2021   (No Known Allergies)    Family History  Problem Relation Age of Onset   Cancer Mother        colon   Cancer Father        lung   Cirrhosis Father    Diabetes Maternal Grandmother    Breast cancer Neg Hx     Social History  Socioeconomic History   Marital status: Married    Spouse name: Not on file   Number of children: Not on file   Years of education: Not on file   Highest education level: Not on file  Occupational History   Not on file  Tobacco Use   Smoking status: Never   Smokeless tobacco: Never  Vaping Use   Vaping Use: Never used  Substance and Sexual Activity   Alcohol use: No   Drug use: No   Sexual activity: Yes    Birth control/protection: None  Other Topics Concern   Not on file  Social History Narrative   Not on file   Social Determinants of Health    Financial Resource Strain: Not on file  Food Insecurity: Not on file  Transportation Needs: Not on file  Physical Activity: Not on file  Stress: Not on file  Social Connections: Not on file  Intimate Partner Violence: Not on file    Review of Systems: See HPI, otherwise negative ROS  Physical Exam: Constitutional: General:   Alert,  Well-developed, well-nourished, pleasant and cooperative in NAD BP (!) 185/85   Pulse 72   Temp (!) 96.5 F (35.8 C) (Temporal)   Resp 20   Ht 5\' 2"  (1.575 m)   Wt 88.5 kg   SpO2 100%   BMI 35.67 kg/m   Head: Normocephalic, atraumatic.   Eyes:  Sclera clear, no icterus.   Conjunctiva pink.   Mouth:  No deformity or lesions, oropharynx pink & moist.  Neck:  Supple, trachea midline  Respiratory: Normal respiratory effort  Gastrointestinal:  Soft, non-tender and non-distended without masses, hepatosplenomegaly or hernias noted.  No guarding or rebound tenderness.     Cardiac: No clubbing or edema.  No cyanosis. Normal posterior tibial pedal pulses noted.  Lymphatic:  No significant cervical adenopathy.  Psych:  Alert and cooperative. Normal mood and affect.  Musculoskeletal:   Symmetrical without gross deformities. 5/5 Lower extremity strength bilaterally.  Skin: Warm. Intact without significant lesions or rashes. No jaundice.  Neurologic:  Face symmetrical, tongue midline, Normal sensation to touch;  grossly normal neurologically.  Psych:  Alert and oriented x3, Alert and cooperative. Normal mood and affect.  Impression/Plan: Erin Crawford is here for a colonoscopy to be performed for history of adenoma  polyps. Pt has been evaluated by hematology and most recent notes have diagnosed her with normocytic anemia due to anemia of chronic disease. Previous notes by Dr. Tasia Catchings in July 2020 mentioned a component of iron deficiency, but most recent notes and workup do not mention iron deficiency with normal ferritin at this time.    Risks, benefits, limitations, and alternatives regarding  colonoscopy have been reviewed with the patient.  Questions have been answered.  All parties agreeable.   Virgel Manifold, MD  06/15/2021, 8:36 AM

## 2021-06-16 ENCOUNTER — Encounter: Payer: Self-pay | Admitting: Gastroenterology

## 2021-06-16 LAB — SURGICAL PATHOLOGY

## 2021-06-17 NOTE — Anesthesia Postprocedure Evaluation (Signed)
Anesthesia Post Note  Patient: Erin Crawford  Procedure(s) Performed: COLONOSCOPY WITH PROPOFOL  Patient location during evaluation: Endoscopy Anesthesia Type: General Level of consciousness: awake and alert Pain management: pain level controlled Vital Signs Assessment: post-procedure vital signs reviewed and stable Respiratory status: spontaneous breathing, nonlabored ventilation and respiratory function stable Cardiovascular status: blood pressure returned to baseline and stable Postop Assessment: no apparent nausea or vomiting Anesthetic complications: no   No notable events documented.   Last Vitals:  Vitals:   06/15/21 0807 06/15/21 0917  BP: (!) 185/85   Pulse: 72   Resp: 20   Temp: (!) 35.8 C (!) 35.9 C  SpO2: 100%     Last Pain:  Vitals:   06/16/21 0739  TempSrc:   PainSc: 0-No pain                 Iran Ouch

## 2021-06-20 ENCOUNTER — Encounter: Payer: Self-pay | Admitting: Oncology

## 2021-06-20 ENCOUNTER — Other Ambulatory Visit: Payer: Self-pay

## 2021-06-20 ENCOUNTER — Other Ambulatory Visit (HOSPITAL_COMMUNITY)
Admission: RE | Admit: 2021-06-20 | Discharge: 2021-06-20 | Disposition: A | Discharge status: Home / Self Care | Payer: Commercial Managed Care - PPO | Source: Ambulatory Visit | Attending: Certified Nurse Midwife | Admitting: Certified Nurse Midwife

## 2021-06-20 ENCOUNTER — Encounter: Payer: Self-pay | Admitting: Certified Nurse Midwife

## 2021-06-20 ENCOUNTER — Ambulatory Visit (INDEPENDENT_AMBULATORY_CARE_PROVIDER_SITE_OTHER): Payer: Commercial Managed Care - PPO | Admitting: Certified Nurse Midwife

## 2021-06-20 VITALS — BP 180/76 | HR 79 | Ht 62.0 in | Wt 199.1 lb

## 2021-06-20 DIAGNOSIS — Z01419 Encounter for gynecological examination (general) (routine) without abnormal findings: Secondary | ICD-10-CM | POA: Insufficient documentation

## 2021-06-20 DIAGNOSIS — Z1231 Encounter for screening mammogram for malignant neoplasm of breast: Secondary | ICD-10-CM | POA: Diagnosis not present

## 2021-06-20 DIAGNOSIS — Z124 Encounter for screening for malignant neoplasm of cervix: Secondary | ICD-10-CM | POA: Insufficient documentation

## 2021-06-20 NOTE — Progress Notes (Signed)
GYNECOLOGY ANNUAL PREVENTATIVE CARE ENCOUNTER NOTE  History:     Addysen Louth is a 60 y.o. G0P0000 female here for a routine annual gynecologic exam.  Current complaints: on dialysis. Will be placed on transplant list.   Denies abnormal vaginal bleeding, discharge, pelvic pain, problems with intercourse or other gynecologic concerns.     Social Relationship: married  Living: with her spouse Work: retired Exercise: Smoke/Alcohol/drug use:  Gynecologic History No LMP recorded. Patient is postmenopausal. Contraception: post menopausal status Last Pap: 08/15/2015. Results were: normal with negative HPV Last mammogram: 04/2021. Results were: normal  Obstetric History OB History  Gravida Para Term Preterm AB Living  0 0 0 0 0 0  SAB IAB Ectopic Multiple Live Births  0 0 0 0 0    Past Medical History:  Diagnosis Date   Anemia    Chronic kidney disease    Diabetes mellitus without complication (HCC)    Hypercholesteremia    Hypertension    LGSIL (low grade squamous intraepithelial dysplasia)    Menopause    Peritoneal dialysis catheter in place [facility])    Sleep apnea    CPAP - (uses sporadically)   Wears dentures    partial lower    Past Surgical History:  Procedure Laterality Date   COLONOSCOPY WITH PROPOFOL N/A 10/07/2019   Procedure: COLONOSCOPY WITH BIOPSY;  Surgeon: Virgel Manifold, MD;  Location: [place];  Service: Endoscopy;  Laterality: N/A;  Diabetic - insulin sleep apnea   COLONOSCOPY WITH PROPOFOL N/A 06/15/2021   Procedure: COLONOSCOPY WITH PROPOFOL;  Surgeon: Virgel Manifold, MD;  Location: [facility];  Service: Endoscopy;  Laterality: N/A;   COLPOSCOPY     ESOPHAGOGASTRODUODENOSCOPY (EGD) WITH PROPOFOL N/A 10/07/2019   Procedure: ESOPHAGOGASTRODUODENOSCOPY (EGD) WITH BIOPSY;  Surgeon: Virgel Manifold, MD;  Location: [place];  Service: Endoscopy;  Laterality: N/A;   POLYPECTOMY N/A 10/07/2019   Procedure:  POLYPECTOMY;  Surgeon: Virgel Manifold, MD;  Location: [place];  Service: Endoscopy;  Laterality: N/A;   TUBAL LIGATION      Current Outpatient Medications on File Prior to Visit  Medication Sig Dispense Refill   amLODipine (NORVASC) 10 MG tablet TAKE 1 TABLET BY MOUTH ONCE DAILY     calcitRIOL (ROCALTROL) 0.25 MCG capsule Take 0.25 mcg by mouth daily.     carvedilol (COREG) 12.5 MG tablet Take 1 tablet (12.5 mg total) by mouth 2 (two) times daily. 60 tablet 5   Cholecalciferol 25 MCG (1000 UT) tablet Take 1 tablet by mouth daily.     cloNIDine (CATAPRES - DOSED IN MG/24 HR) 0.1 mg/24hr patch Place onto the skin.     dorzolamide-timolol (COSOPT) 22.3-6.8 MG/ML ophthalmic solution INSTILL 1 DROP INTO EACH EYE TWICE DAILY     Dulaglutide 3 MG/0.5ML SOPN Inject into the skin once a week.     glucose blood test strip Use 2 (two) times daily. Use as instructed.     hydrALAZINE (APRESOLINE) 25 MG tablet Take 1 tablet by mouth 3 (three) times daily.     insulin glargine, 2 Unit Dial, (TOUJEO MAX SOLOSTAR) 300 UNIT/ML Solostar Pen Inject 38 Units into the skin daily.     Multiple Vitamin (MULTI-VITAMINS) TABS Take 1 tablet by mouth daily.     rosuvastatin (CRESTOR) 40 MG tablet Take 1 tablet (40 mg total) by mouth at bedtime. 30 tablet 5   furosemide (LASIX) 80 MG tablet Take 80 mg by mouth daily. (Patient not taking: Reported  on 06/20/2021)     No current facility-administered medications on file prior to visit.    No Known Allergies  Social History:  reports that she has never smoked. She has never used smokeless tobacco. She reports that she does not drink alcohol and does not use drugs.  Family History  Problem Relation Age of Onset   Cancer Mother        colon   Cancer Father        lung   Cirrhosis Father    Diabetes Maternal Grandmother    Breast cancer Neg Hx     The following portions of the patient's history were reviewed and updated as appropriate: allergies,  current medications, past family history, past medical history, past social history, past surgical history and problem list.  Review of Systems Pertinent items noted in HPI and remainder of comprehensive ROS otherwise negative.  Physical Exam:  BP (!) 180/76   Pulse 79   Ht 5\' 2"  (1.575 m)   Wt 199 lb 1.6 oz (90.3 kg)   BMI 36.42 kg/m  CONSTITUTIONAL: Well-developed, well-nourished , obese female in no acute distress.  HENT:  Normocephalic, atraumatic, External right and left ear normal. Oropharynx is clear and moist EYES: Conjunctivae and EOM are normal. Pupils are equal, round, and reactive to light. No scleral icterus.  NECK: Normal range of motion, supple, no masses.  Normal thyroid.  SKIN: Skin is warm and dry. No rash noted. Not diaphoretic. No erythema. No pallor. MUSCULOSKELETAL: Normal range of motion. No tenderness.  No cyanosis, clubbing, or edema.  2+ distal pulses. NEUROLOGIC: Alert and oriented to person, place, and time. Normal reflexes, muscle tone coordination.  PSYCHIATRIC: Normal mood and affect. Normal behavior. Normal judgment and thought content. CARDIOVASCULAR: Normal heart rate noted, regular rhythm RESPIRATORY: Clear to auscultation bilaterally. Effort and breath sounds normal, no problems with respiration noted. BREASTS: Symmetric in size. No masses, tenderness, skin changes, nipple drainage, or lymphadenopathy bilaterally.  ABDOMEN: Soft, no distention noted.  No tenderness, rebound or guarding.  PELVIC: Normal appearing external genitalia and urethral meatus; normal appearing vaginal mucosa and cervix.  No abnormal discharge noted.  Pap smear obtained.  Contract bleeding present .Normal uterine size, no other palpable masses, no uterine or adnexal tenderness.  .   Assessment and Plan:  Annual [facility] GYN Exam   Pap:Will follow up results of pap smear and manage accordingly. Mammogram : ordered Labs: none due  Refills: none Referral: none Routine  preventative health maintenance measures emphasized. Please refer to After Visit Summary for other counseling recommendations.      Philip Aspen, CNM [facility]

## 2021-06-24 LAB — CYTOLOGY - PAP
Comment: NEGATIVE
Diagnosis: UNDETERMINED — AB
High risk HPV: NEGATIVE

## 2021-06-26 ENCOUNTER — Other Ambulatory Visit: Payer: Self-pay | Admitting: Certified Nurse Midwife

## 2021-06-26 MED ORDER — FLUCONAZOLE 150 MG PO TABS: 150.0000 mg | ORAL_TABLET | Freq: Once | ORAL | 0 refills | Status: AC

## 2022-02-23 ENCOUNTER — Other Ambulatory Visit: Payer: Self-pay | Admitting: Nurse Practitioner

## 2022-02-23 DIAGNOSIS — Z1231 Encounter for screening mammogram for malignant neoplasm of breast: Secondary | ICD-10-CM

## 2022-05-17 ENCOUNTER — Ambulatory Visit
Admission: RE | Admit: 2022-05-17 | Discharge: 2022-05-17 | Disposition: A | Discharge status: Home / Self Care | Payer: Commercial Managed Care - PPO | Source: Ambulatory Visit | Attending: Nurse Practitioner | Admitting: Nurse Practitioner

## 2022-05-17 DIAGNOSIS — Z1231 Encounter for screening mammogram for malignant neoplasm of breast: Secondary | ICD-10-CM | POA: Insufficient documentation

## 2022-06-02 ENCOUNTER — Encounter: Payer: Self-pay | Admitting: Cardiology

## 2022-06-02 ENCOUNTER — Ambulatory Visit: Payer: Commercial Managed Care - PPO | Attending: Cardiology | Admitting: Cardiology

## 2022-06-02 VITALS — BP 140/90 | HR 75 | Ht 62.0 in | Wt 180.0 lb

## 2022-06-02 DIAGNOSIS — Z01818 Encounter for other preprocedural examination: Secondary | ICD-10-CM | POA: Diagnosis not present

## 2022-06-02 DIAGNOSIS — R011 Cardiac murmur, unspecified: Secondary | ICD-10-CM

## 2022-06-02 DIAGNOSIS — E7800 Pure hypercholesterolemia, unspecified: Secondary | ICD-10-CM

## 2022-06-02 DIAGNOSIS — I10 Essential (primary) hypertension: Secondary | ICD-10-CM

## 2022-06-02 NOTE — Progress Notes (Signed)
Cardiology Office Note:    Date:  06/02/2022   ID:  Emmit Alexanders, DOB [birth year], MRN [id number]  PCP:  [name], NP   [facility] Providers Cardiologist:  Kate Sable, MD     Referring MD: [name], *   Chief Complaint  Patient presents with   office visit-Patient requesting stress test for transplant     Requirement (kidney)    History of Present Illness:    Erin Crawford is a 61 y.o. female with a hx of hypertension, hyperlipidemia, diabetes, end-stage renal disease on dialysis who presents for pretransplant work-up.  Patient seen about a year ago at the time was considering renal transplant.  She had work-up at [facility] with echocardiogram and stress echocardiogram.  She was recently placed on a transplant list, previous work-up not valid since it is over a year.  Repeat cardiac eval being requested by transplant team.  Patient lives in the area and would like to perform stress test locally.  She feels well, denies any symptoms of chest pain or shortness of breath.   Prior notes Stress echo 04/2021 [facility] intermediate study, normal EF, Echo 04/2021 [facility] EF 55%, mild TR, trivial PR Cardiac monitor 02/2021 no significant arrhythmias, occasional paroxysmal SVT. An echocardiogram back in 2017 at [facility] which was normal.   Past Medical History:  Diagnosis Date   Anemia    Chronic kidney disease    Diabetes mellitus without complication (HCC)    Hypercholesteremia    Hypertension    LGSIL (low grade squamous intraepithelial dysplasia)    Menopause    Peritoneal dialysis catheter in place [facility])    Sleep apnea    CPAP - (uses sporadically)   Wears dentures    partial lower    Past Surgical History:  Procedure Laterality Date   COLONOSCOPY WITH PROPOFOL N/A 10/07/2019   Procedure: COLONOSCOPY WITH BIOPSY;  Surgeon: Virgel Manifold, MD;  Location: [place];  Service: Endoscopy;   Laterality: N/A;  Diabetic - insulin sleep apnea   COLONOSCOPY WITH PROPOFOL N/A 06/15/2021   Procedure: COLONOSCOPY WITH PROPOFOL;  Surgeon: Virgel Manifold, MD;  Location: [facility];  Service: Endoscopy;  Laterality: N/A;   COLPOSCOPY     ESOPHAGOGASTRODUODENOSCOPY (EGD) WITH PROPOFOL N/A 10/07/2019   Procedure: ESOPHAGOGASTRODUODENOSCOPY (EGD) WITH BIOPSY;  Surgeon: Virgel Manifold, MD;  Location: [place];  Service: Endoscopy;  Laterality: N/A;   POLYPECTOMY N/A 10/07/2019   Procedure: POLYPECTOMY;  Surgeon: Virgel Manifold, MD;  Location: [place];  Service: Endoscopy;  Laterality: N/A;   TUBAL LIGATION      Current Medications: Current Meds  Medication Sig   amLODipine (NORVASC) 10 MG tablet TAKE 1 TABLET BY MOUTH ONCE DAILY   calcitRIOL (ROCALTROL) 0.25 MCG capsule Take 0.25 mcg by mouth daily.   carvedilol (COREG) 12.5 MG tablet Take 1 tablet (12.5 mg total) by mouth 2 (two) times daily.   Cholecalciferol 25 MCG (1000 UT) tablet Take 1 tablet by mouth daily.   cloNIDine (CATAPRES - DOSED IN MG/24 HR) 0.1 mg/24hr patch Place onto the skin.   dorzolamide-timolol (COSOPT) 22.3-6.8 MG/ML ophthalmic solution INSTILL 1 DROP INTO EACH EYE TWICE DAILY   Dulaglutide 3 MG/0.5ML SOPN Inject into the skin once a week.   furosemide (LASIX) 80 MG tablet Take 80 mg by mouth daily.   glucose blood test strip Use 2 (two) times daily. Use as instructed.   hydrALAZINE (APRESOLINE) 25 MG tablet Take  1 tablet by mouth 3 (three) times daily.   insulin glargine, 2 Unit Dial, (TOUJEO MAX SOLOSTAR) 300 UNIT/ML Solostar Pen Inject 38 Units into the skin daily.   Multiple Vitamin (MULTI-VITAMINS) TABS Take 1 tablet by mouth daily.   rosuvastatin (CRESTOR) 40 MG tablet Take 1 tablet (40 mg total) by mouth at bedtime.     Allergies:   Patient has no known allergies.   Social History   Socioeconomic History   Marital status: Married    Spouse name: Not on file    Number of children: Not on file   Years of education: Not on file   Highest education level: Not on file  Occupational History   Not on file  Tobacco Use   Smoking status: Never   Smokeless tobacco: Never  Vaping Use   Vaping Use: Never used  Substance and Sexual Activity   Alcohol use: No   Drug use: No   Sexual activity: Yes    Birth control/protection: None  Other Topics Concern   Not on file  Social History Narrative   Not on file   Social Determinants of Health   Financial Resource Strain: Not on file  Food Insecurity: Not on file  Transportation Needs: Not on file  Physical Activity: Not on file  Stress: Not on file  Social Connections: Not on file     Family History: The patient's family history includes Cancer in her father and mother; Cirrhosis in her father; Diabetes in her maternal grandmother. There is no history of Breast cancer.  ROS:   Please see the history of present illness.     All other systems reviewed and are negative.  EKGs/Labs/Other Studies Reviewed:    The following studies were reviewed today:   EKG:  EKG is ordered today.  EKG shows normal sinus rhythm, possible left atrial enlargement.  Recent Labs: No results found for requested labs within last 365 days.  Recent Lipid Panel    Component Value Date/Time   CHOL 258 (H) 02/04/2021 0949   CHOL 289 (H) 08/17/2015 1030   CHOL 210 (H) 01/09/2014 1333   TRIG 149 02/04/2021 0949   TRIG 150 01/09/2014 1333   HDL 55 02/04/2021 0949   HDL 49 08/17/2015 1030   HDL 45 01/09/2014 1333   CHOLHDL 4.7 02/04/2021 0949   VLDL 30 02/04/2021 0949   VLDL 30 01/09/2014 1333   LDLCALC 173 (H) 02/04/2021 0949   LDLCALC 198 (H) 08/17/2015 1030   LDLCALC 135 (H) 01/09/2014 1333     Risk Assessment/Calculations:      Physical Exam:    VS:  BP (!) 140/90 (BP Location: Left Arm, Patient Position: Sitting, Cuff Size: Large)   Pulse 75   Ht '5\' 2"'$  (1.575 m)   Wt 180 lb (81.6 kg)   SpO2 99%    BMI 32.92 kg/m     Wt Readings from Last 3 Encounters:  06/02/22 180 lb (81.6 kg)  06/20/21 199 lb 1.6 oz (90.3 kg)  06/15/21 195 lb (88.5 kg)     GEN:  Well nourished, well developed in no acute distress HEENT: Normal NECK: No JVD; No carotid bruits CARDIAC: RRR, 2/6 systolic murmur. RESPIRATORY:  Clear to auscultation without rales, wheezing or rhonchi  ABDOMEN: Soft, non-tender, non-distended MUSCULOSKELETAL:  No edema; No deformity  SKIN: Warm and dry NEUROLOGIC:  Alert and oriented x 3 PSYCHIATRIC:  Normal affect   ASSESSMENT:    1. Pre-transplant evaluation for ESRD (end stage renal disease)  2. Systolic murmur   3. Primary hypertension   4. Pure hypercholesterolemia    PLAN:    In order of problems listed above:  Pretransplant work-up for end-stage renal disease.  Stress echo 1 year ago was normal.  Repeat stress echo. Systolic murmur, get echocardiogram to evaluate any structural abnormalities. Hypertension, BP reasonable.  Continue Coreg to 12.5 mg twice daily, amlodipine, hydralazine, clonidine as prescribed. Hyperlipidemia, continue Crestor 40 mg daily.  Repeat lipid panel in 6 weeks.  Follow-up in 2 months.   Medication Adjustments/Labs and Tests Ordered: Current medicines are reviewed at length with the patient today.  Concerns regarding medicines are outlined above.  Orders Placed This Encounter  Procedures   Lipid panel   EKG 12-Lead   ECHOCARDIOGRAM COMPLETE   ECHOCARDIOGRAM STRESS TEST    No orders of the defined types were placed in this encounter.    Patient Instructions  Medication Instructions:   Your physician recommends that you continue on your current medications as directed. Please refer to the Current Medication list given to you today.  *If you need a refill on your cardiac medications before your next appointment, please call your pharmacy*  LABS:  Your physician recommends that you return for a FASTING lipid profile: at your  earliest convenience.  - You will need to be fasting. Please do not have anything to eat or drink after midnight the morning you have the lab work. You may only have water or black coffee with no cream or sugar.   - [facility] You will check in at the front desk to the right as you walk into the atrium. Valet Parking is offered if needed. - No appointment needed. You may go any day between 7 am and 6 pm.     Testing/Procedures:  Your physician has requested that you have an echocardiogram. Echocardiography is a painless test that uses sound waves to create images of your heart. It provides your doctor with information about the size and shape of your heart and how well your heart's chambers and valves are working. This procedure takes approximately one hour. There are no restrictions for this procedure.   2.     Your physician has requested that you have a stress echo.  An exercise stress echo assesses your heart's function when it's beating fast. You create this "stress" by exercising on a treadmill or bicycle  .- you may eat a light breakfast/ lunch prior to your procedure - no caffeine for 24 hours prior to your test (coffee, tea, soft drinks, or chocolate)  - no smoking/ vaping for 4 hours prior to your test - you may take your regular medications the day of your test except for: DO NOT take _Carvedilol_for 24 hours prior - bring any inhalers with you to your test - wear comfortable clothing & tennis/ non-skid shoes to walk on the treadmill   Someone will reach out to you from our [employer] office to schedule you for this testing.    Follow-Up: At [facility], you and your health needs are our priority.  As part of our continuing mission to provide you with exceptional heart care, we have created designated Provider Care Teams.  These Care Teams include your primary Cardiologist (physician) and Advanced Practice Providers (APPs -  Physician  Assistants and Nurse Practitioners) who all work together to provide you with the care you need, when you need it.  We recommend signing up for the  patient portal called "MyChart".  Sign up information is provided on this After Visit Summary.  MyChart is used to connect with patients for Virtual Visits (Telemedicine).  Patients are able to view lab/test results, encounter notes, upcoming appointments, etc.  Non-urgent messages can be sent to your provider as well.   To learn more about what you can do with MyChart, go to [email or web address].    Your next appointment:   2 month(s)  The format for your next appointment:   In Person  Provider:   You may see Kate Sable, MD or one of the following Advanced Practice Providers on your designated Care Team:   Murray Hodgkins, NP Christell Faith, PA-C Cadence Kathlen Mody, PA-C Gerrie Nordmann, NP    Other Instructions   Important Information About Sugar         Signed, Kate Sable, MD  06/02/2022 5:05 PM    [place]

## 2022-06-02 NOTE — Patient Instructions (Signed)
Medication Instructions:   Your physician recommends that you continue on your current medications as directed. Please refer to the Current Medication list given to you today.  *If you need a refill on your cardiac medications before your next appointment, please call your pharmacy*  LABS:  Your physician recommends that you return for a FASTING lipid profile: at your earliest convenience.  - You will need to be fasting. Please do not have anything to eat or drink after midnight the morning you have the lab work. You may only have water or black coffee with no cream or sugar.   - Please go to the [facility]. You will check in at the front desk to the right as you walk into the atrium. Valet Parking is offered if needed. - No appointment needed. You may go any day between 7 am and 6 pm.     Testing/Procedures:  Your physician has requested that you have an echocardiogram. Echocardiography is a painless test that uses sound waves to create images of your heart. It provides your doctor with information about the size and shape of your heart and how well your heart's chambers and valves are working. This procedure takes approximately one hour. There are no restrictions for this procedure.   2.     Your physician has requested that you have a stress echo.  An exercise stress echo assesses your heart's function when it's beating fast. You create this "stress" by exercising on a treadmill or bicycle  .- you may eat a light breakfast/ lunch prior to your procedure - no caffeine for 24 hours prior to your test (coffee, tea, soft drinks, or chocolate)  - no smoking/ vaping for 4 hours prior to your test - you may take your regular medications the day of your test except for: DO NOT take _Carvedilol_for 24 hours prior - bring any inhalers with you to your test - wear comfortable clothing & tennis/ non-skid shoes to walk on the treadmill   Someone will reach out to you from our [employer] office to schedule you for this testing.    Follow-Up: At [facility], you and your health needs are our priority.  As part of our continuing mission to provide you with exceptional heart care, we have created designated Provider Care Teams.  These Care Teams include your primary Cardiologist (physician) and Advanced Practice Providers (APPs -  Physician Assistants and Nurse Practitioners) who all work together to provide you with the care you need, when you need it.  We recommend signing up for the patient portal called "MyChart".  Sign up information is provided on this After Visit Summary.  MyChart is used to connect with patients for Virtual Visits (Telemedicine).  Patients are able to view lab/test results, encounter notes, upcoming appointments, etc.  Non-urgent messages can be sent to your provider as well.   To learn more about what you can do with MyChart, go to [email or web address].    Your next appointment:   2 month(s)  The format for your next appointment:   In Person  Provider:   You may see Kate Sable, MD or one of the following Advanced Practice Providers on your designated Care Team:   Murray Hodgkins, NP Christell Faith, PA-C Cadence Kathlen Mody, PA-C Gerrie Nordmann, NP    Other Instructions   Important Information About Sugar

## 2022-06-05 NOTE — Addendum Note (Signed)
Addended by: Kavin Leech on: 06/05/2022 02:46 PM   Modules accepted: Orders

## 2022-06-05 NOTE — Addendum Note (Signed)
Addended by: Kate Sable on: 06/05/2022 02:48 PM   Modules accepted: Orders

## 2022-06-19 ENCOUNTER — Other Ambulatory Visit
Admission: RE | Admit: 2022-06-19 | Discharge: 2022-06-19 | Disposition: A | Discharge status: Home / Self Care | Payer: Commercial Managed Care - PPO | Attending: Cardiology | Admitting: Cardiology

## 2022-06-19 ENCOUNTER — Other Ambulatory Visit
Admission: RE | Admit: 2022-06-19 | Discharge: 2022-06-19 | Disposition: A | Discharge status: Home / Self Care | Payer: Commercial Managed Care - PPO | Attending: "Endocrinology | Admitting: "Endocrinology

## 2022-06-19 DIAGNOSIS — E7800 Pure hypercholesterolemia, unspecified: Secondary | ICD-10-CM | POA: Insufficient documentation

## 2022-06-19 LAB — LIPID PANEL
Cholesterol: 153 mg/dL (ref 0–200)
HDL: 36 mg/dL — ABNORMAL LOW (ref >40)
LDL Cholesterol: 101 mg/dL — ABNORMAL HIGH (ref 0–99)
Total CHOL/HDL Ratio: 4.3 RATIO
Triglycerides: 80 mg/dL (ref <150)
VLDL: 16 mg/dL (ref 0–40)

## 2022-06-28 ENCOUNTER — Telehealth: Payer: Self-pay

## 2022-06-28 NOTE — Telephone Encounter (Signed)
Spoke with the patient, detailed instructions given. She stated that she would be here for her test. Asked to call back with any questions. S.Kealani Leckey EMTP 

## 2022-06-29 ENCOUNTER — Ambulatory Visit (HOSPITAL_COMMUNITY): Payer: Commercial Managed Care - PPO

## 2022-06-29 ENCOUNTER — Ambulatory Visit (HOSPITAL_COMMUNITY): Payer: Commercial Managed Care - PPO | Attending: Cardiology

## 2022-06-29 ENCOUNTER — Ambulatory Visit (HOSPITAL_BASED_OUTPATIENT_CLINIC_OR_DEPARTMENT_OTHER): Payer: Commercial Managed Care - PPO

## 2022-06-29 DIAGNOSIS — R011 Cardiac murmur, unspecified: Secondary | ICD-10-CM

## 2022-06-29 DIAGNOSIS — Z01818 Encounter for other preprocedural examination: Secondary | ICD-10-CM

## 2022-06-29 LAB — ECHOCARDIOGRAM COMPLETE
AR max vel: 1.42 cm2
AV Area VTI: 1.56 cm2
AV Area mean vel: 1.42 cm2
AV Mean grad: 13 mmHg
AV Peak grad: 21 mmHg
Ao pk vel: 2.29 m/s
Area-P 1/2: 2.81 cm2
S' Lateral: 2.5 cm

## 2022-06-29 MED ORDER — PERFLUTREN LIPID MICROSPHERE
1.0000 mL | INTRAVENOUS | Status: AC | PRN
  Administered 2022-06-29: 3 mL via INTRAVENOUS

## 2022-07-03 ENCOUNTER — Ambulatory Visit (INDEPENDENT_AMBULATORY_CARE_PROVIDER_SITE_OTHER): Payer: Commercial Managed Care - PPO | Admitting: Certified Nurse Midwife

## 2022-07-03 ENCOUNTER — Encounter: Payer: Self-pay | Admitting: Certified Nurse Midwife

## 2022-07-03 ENCOUNTER — Other Ambulatory Visit (HOSPITAL_COMMUNITY)
Admission: RE | Admit: 2022-07-03 | Discharge: 2022-07-03 | Disposition: A | Discharge status: Home / Self Care | Payer: Commercial Managed Care - PPO | Source: Ambulatory Visit | Attending: Certified Nurse Midwife | Admitting: Certified Nurse Midwife

## 2022-07-03 VITALS — BP 132/75 | HR 75 | Resp 16 | Wt 180.5 lb

## 2022-07-03 DIAGNOSIS — Z124 Encounter for screening for malignant neoplasm of cervix: Secondary | ICD-10-CM | POA: Diagnosis present

## 2022-07-03 DIAGNOSIS — Z01419 Encounter for gynecological examination (general) (routine) without abnormal findings: Secondary | ICD-10-CM | POA: Diagnosis not present

## 2022-07-03 NOTE — Patient Instructions (Signed)
Preventive Care 61-61 Years Old, Female Preventive care refers to lifestyle choices and visits with your health care provider that can promote health and wellness. Preventive care visits are also called wellness exams. What can I expect for my preventive care visit? Counseling Your health care provider may ask you questions about your: Medical history, including: Past medical problems. Family medical history. Pregnancy history. Current health, including: Menstrual cycle. Method of birth control. Emotional well-being. Home life and relationship well-being. Sexual activity and sexual health. Lifestyle, including: Alcohol, nicotine or tobacco, and drug use. Access to firearms. Diet, exercise, and sleep habits. Work and work environment. Sunscreen use. Safety issues such as seatbelt and bike helmet use. Physical exam Your health care provider will check your: Height and weight. These may be used to calculate your BMI (body mass index). BMI is a measurement that tells if you are at a healthy weight. Waist circumference. This measures the distance around your waistline. This measurement also tells if you are at a healthy weight and may help predict your risk of certain diseases, such as type 2 diabetes and high blood pressure. Heart rate and blood pressure. Body temperature. Skin for abnormal spots. What immunizations do I need?  Vaccines are usually given at various ages, according to a schedule. Your health care provider will recommend vaccines for you based on your age, medical history, and lifestyle or other factors, such as travel or where you work. What tests do I need? Screening Your health care provider may recommend screening tests for certain conditions. This may include: Lipid and cholesterol levels. Diabetes screening. This is done by checking your blood sugar (glucose) after you have not eaten for a while (fasting). Pelvic exam and Pap test. Hepatitis B test. Hepatitis C  test. HIV (human immunodeficiency virus) test. STI (sexually transmitted infection) testing, if you are at risk. Lung cancer screening. Colorectal cancer screening. Mammogram. Talk with your health care provider about when you should start having regular mammograms. This may depend on whether you have a family history of breast cancer. BRCA-related cancer screening. This may be done if you have a family history of breast, ovarian, tubal, or peritoneal cancers. Bone density scan. This is done to screen for osteoporosis. Talk with your health care provider about your test results, treatment options, and if necessary, the need for more tests. Follow these instructions at home: Eating and drinking  Eat a diet that includes fresh fruits and vegetables, whole grains, lean protein, and low-fat dairy products. Take vitamin and mineral supplements as recommended by your health care provider. Do not drink alcohol if: Your health care provider tells you not to drink. You are pregnant, may be pregnant, or are planning to become pregnant. If you drink alcohol: Limit how much you have to 0-1 drink a day. Know how much alcohol is in your drink. In the [place] one drink equals one 12 oz bottle of beer (355 mL), one 5 oz glass of wine (148 mL), or one 1 oz glass of hard liquor (44 mL). Lifestyle Brush your teeth every morning and night with fluoride toothpaste. Floss one time each day. Exercise for at least 30 minutes 5 or more days each week. Do not use any products that contain nicotine or tobacco. These products include cigarettes, chewing tobacco, and vaping devices, such as e-cigarettes. If you need help quitting, ask your health care provider. Do not use drugs. If you are sexually active, practice safe sex. Use a condom or other form of protection to   prevent STIs. If you do not wish to become pregnant, use a form of birth control. If you plan to become pregnant, see your health care provider for a  prepregnancy visit. Take aspirin only as told by your health care provider. Make sure that you understand how much to take and what form to take. Work with your health care provider to find out whether it is safe and beneficial for you to take aspirin daily. Find healthy ways to manage stress, such as: Meditation, yoga, or listening to music. Journaling. Talking to a trusted person. Spending time with friends and family. Minimize exposure to UV radiation to reduce your risk of skin cancer. Safety Always wear your seat belt while driving or riding in a vehicle. Do not drive: If you have been drinking alcohol. Do not ride with someone who has been drinking. When you are tired or distracted. While texting. If you have been using any mind-altering substances or drugs. Wear a helmet and other protective equipment during sports activities. If you have firearms in your house, make sure you follow all gun safety procedures. Seek help if you have been physically or sexually abused. What's next? Visit your health care provider once a year for an annual wellness visit. Ask your health care provider how often you should have your eyes and teeth checked. Stay up to date on all vaccines. This information is not intended to replace advice given to you by your health care provider. Make sure you discuss any questions you have with your health care provider. Document Revised: 03/09/2021 Document Reviewed: 03/09/2021 Elsevier Patient Education  [place].

## 2022-07-03 NOTE — Progress Notes (Signed)
GYNECOLOGY ANNUAL PREVENTATIVE CARE ENCOUNTER NOTE  History:     Erin Crawford is a 61 y.o. G0P0000 female here for a routine annual gynecologic exam.  Current complaints: none.   Denies abnormal vaginal bleeding, discharge, pelvic pain, problems with intercourse or other gynecologic concerns.    Pt is on kidney transplant list.   Social Relationship: Married  Living: spouse  Work: Retired Exercise: none Smoke/Alcohol/drug use: denies use   Gynecologic History No LMP recorded. Patient is postmenopausal. Contraception: post menopausal status Last Pap: 06/20/2021. Results were: ASCUS with negative HPV Last mammogram: 04/2022. Results were: normal  Obstetric History OB History  Gravida Para Term Preterm AB Living  0 0 0 0 0 0  SAB IAB Ectopic Multiple Live Births  0 0 0 0 0    Past Medical History:  Diagnosis Date   Anemia    Chronic kidney disease    Diabetes mellitus without complication (HCC)    Hypercholesteremia    Hypertension    LGSIL (low grade squamous intraepithelial dysplasia)    Menopause    Peritoneal dialysis catheter in place [facility])    Sleep apnea    CPAP - (uses sporadically)   Wears dentures    partial lower    Past Surgical History:  Procedure Laterality Date   COLONOSCOPY WITH PROPOFOL N/A 10/07/2019   Procedure: COLONOSCOPY WITH BIOPSY;  Surgeon: Virgel Manifold, MD;  Location: [place];  Service: Endoscopy;  Laterality: N/A;  Diabetic - insulin sleep apnea   COLONOSCOPY WITH PROPOFOL N/A 06/15/2021   Procedure: COLONOSCOPY WITH PROPOFOL;  Surgeon: Virgel Manifold, MD;  Location: [facility] END[facility];  Service: Endoscopy;  Laterality: N/A;   COLPOSCOPY     ESOPHAGOGASTRODUODENOSCOPY (EGD) WITH PROPOFOL N/A 10/07/2019   Procedure: ESOPHAGOGASTRODUODENOSCOPY (EGD) WITH BIOPSY;  Surgeon: Virgel Manifold, MD;  Location: [place];  Service: Endoscopy;  Laterality: N/A;   POLYPECTOMY N/A 10/07/2019   Procedure:  POLYPECTOMY;  Surgeon: Virgel Manifold, MD;  Location: [place];  Service: Endoscopy;  Laterality: N/A;   TUBAL LIGATION      Current Outpatient Medications on File Prior to Visit  Medication Sig Dispense Refill   amLODipine (NORVASC) 10 MG tablet TAKE 1 TABLET BY MOUTH ONCE DAILY     calcitRIOL (ROCALTROL) 0.25 MCG capsule Take 0.25 mcg by mouth daily.     carvedilol (COREG) 12.5 MG tablet Take 1 tablet (12.5 mg total) by mouth 2 (two) times daily. 60 tablet 5   Cholecalciferol 25 MCG (1000 UT) tablet Take 1 tablet by mouth daily.     cloNIDine (CATAPRES - DOSED IN MG/24 HR) 0.1 mg/24hr patch Place onto the skin.     Difluprednate 0.05 % EMUL Place 1 drop into the right eye 4 (four) times daily.     dorzolamide-timolol (COSOPT) 22.3-6.8 MG/ML ophthalmic solution INSTILL 1 DROP INTO EACH EYE TWICE DAILY     Dulaglutide 3 MG/0.5ML SOPN Inject into the skin once a week.     furosemide (LASIX) 80 MG tablet Take 80 mg by mouth daily.     glucose blood test strip Use 2 (two) times daily. Use as instructed.     hydrALAZINE (APRESOLINE) 25 MG tablet Take 1 tablet by mouth 3 (three) times daily.     insulin glargine, 2 Unit Dial, (TOUJEO MAX SOLOSTAR) 300 UNIT/ML Solostar Pen Inject 38 Units into the skin daily.     losartan (COZAAR) 100 MG tablet Take 100 mg by mouth daily.  MOUNJARO 5 MG/0.5ML Pen Inject into the skin.     Multiple Vitamin (MULTI-VITAMINS) TABS Take 1 tablet by mouth daily.     omeprazole (PRILOSEC) 40 MG capsule Take 40 mg by mouth daily.     rosuvastatin (CRESTOR) 40 MG tablet Take 1 tablet (40 mg total) by mouth at bedtime. 30 tablet 5   No current facility-administered medications on file prior to visit.    No Known Allergies  Social History:  reports that she has never smoked. She has never used smokeless tobacco. She reports that she does not drink alcohol and does not use drugs.  Family History  Problem Relation Age of Onset   Cancer Mother         colon   Cancer Father        lung   Cirrhosis Father    Diabetes Maternal Grandmother    Breast cancer Neg Hx     The following portions of the patient's history were reviewed and updated as appropriate: allergies, current medications, past family history, past medical history, past social history, past surgical history and problem list.  Review of Systems Pertinent items noted in HPI and remainder of comprehensive ROS otherwise negative.  Physical Exam:  BP 132/75   Pulse 75   Resp 16   Wt 180 lb 8 oz (81.9 kg)   SpO2 99%   BMI 33.01 kg/m  CONSTITUTIONAL: Well-developed, well-nourished female in no acute distress.  HENT:  Normocephalic, atraumatic, External right and left ear normal. Oropharynx is clear and moist EYES: Conjunctivae and EOM are normal. Pupils are equal, round, and reactive to light. No scleral icterus.  NECK: Normal range of motion, supple, no masses.  Normal thyroid.  SKIN: Skin is warm and dry. No rash noted. Not diaphoretic. No erythema. No pallor. MUSCULOSKELETAL: Normal range of motion. No tenderness.  No cyanosis, clubbing, or edema.  2+ distal pulses. NEUROLOGIC: Alert and oriented to person, place, and time. Normal reflexes, muscle tone coordination.  PSYCHIATRIC: Normal mood and affect. Normal behavior. Normal judgment and thought content. CARDIOVASCULAR: Normal heart rate noted, regular rhythm RESPIRATORY: Clear to auscultation bilaterally. Effort and breath sounds normal, no problems with respiration noted. BREASTS: Symmetric in size. No masses, tenderness, skin changes, nipple drainage, or lymphadenopathy bilaterally.  ABDOMEN: Soft, no distention noted.  No tenderness, rebound or guarding.  PELVIC: Normal appearing external genitalia and urethral meatus; normal appearing vaginal mucosa and cervix.  No abnormal discharge noted.  Pap smear obtained.  Normal uterine size, no other palpable masses, no uterine or adnexal tenderness.  .   Assessment and  Plan:    1. Screening for cervical cancer - Cytology - PAP  2. Women's annual routine gynecological examination    Pap: Will follow up results of pap smear and manage accordingly. Mammogram :  not due until next year Labs: none  Refills:none Flu: has done with PCP Referral: none Routine preventative health maintenance measures emphasized. Please refer to After Visit Summary for other counseling recommendations.      Philip Aspen, CNM [facility]

## 2022-07-04 ENCOUNTER — Other Ambulatory Visit
Admission: RE | Admit: 2022-07-04 | Discharge: 2022-07-04 | Disposition: A | Discharge status: Home / Self Care | Payer: Commercial Managed Care - PPO | Source: Ambulatory Visit | Attending: Nephrology | Admitting: Nephrology

## 2022-07-04 DIAGNOSIS — N186 End stage renal disease: Secondary | ICD-10-CM | POA: Diagnosis present

## 2022-07-04 DIAGNOSIS — E875 Hyperkalemia: Secondary | ICD-10-CM | POA: Diagnosis present

## 2022-07-04 LAB — POTASSIUM: Potassium: 3.5 mmol/L (ref 3.5–5.1)

## 2022-07-05 LAB — CYTOLOGY - PAP
Comment: NEGATIVE
Diagnosis: NEGATIVE
High risk HPV: NEGATIVE

## 2022-08-03 ENCOUNTER — Ambulatory Visit: Payer: Commercial Managed Care - PPO | Admitting: Cardiology

## 2023-03-01 ENCOUNTER — Other Ambulatory Visit: Payer: Self-pay | Admitting: Nurse Practitioner

## 2023-03-01 DIAGNOSIS — Z1231 Encounter for screening mammogram for malignant neoplasm of breast: Secondary | ICD-10-CM

## 2023-05-21 ENCOUNTER — Ambulatory Visit
Admission: RE | Admit: 2023-05-21 | Discharge: 2023-05-21 | Disposition: A | Discharge status: Home / Self Care | Payer: Commercial Managed Care - PPO | Source: Ambulatory Visit | Attending: Nurse Practitioner | Admitting: Nurse Practitioner

## 2023-05-21 DIAGNOSIS — Z1231 Encounter for screening mammogram for malignant neoplasm of breast: Secondary | ICD-10-CM | POA: Insufficient documentation

## 2023-05-27 DIAGNOSIS — N186 End stage renal disease: Secondary | ICD-10-CM | POA: Diagnosis not present

## 2023-05-27 DIAGNOSIS — Z992 Dependence on renal dialysis: Secondary | ICD-10-CM | POA: Diagnosis not present

## 2023-05-28 DIAGNOSIS — N186 End stage renal disease: Secondary | ICD-10-CM | POA: Diagnosis not present

## 2023-05-28 DIAGNOSIS — Z992 Dependence on renal dialysis: Secondary | ICD-10-CM | POA: Diagnosis not present

## 2023-05-29 DIAGNOSIS — Z992 Dependence on renal dialysis: Secondary | ICD-10-CM | POA: Diagnosis not present

## 2023-05-29 DIAGNOSIS — N186 End stage renal disease: Secondary | ICD-10-CM | POA: Diagnosis not present

## 2023-05-30 DIAGNOSIS — Z992 Dependence on renal dialysis: Secondary | ICD-10-CM | POA: Diagnosis not present

## 2023-05-30 DIAGNOSIS — N186 End stage renal disease: Secondary | ICD-10-CM | POA: Diagnosis not present

## 2023-05-31 ENCOUNTER — Telehealth: Payer: Self-pay

## 2023-05-31 DIAGNOSIS — Z992 Dependence on renal dialysis: Secondary | ICD-10-CM | POA: Diagnosis not present

## 2023-05-31 DIAGNOSIS — N186 End stage renal disease: Secondary | ICD-10-CM | POA: Diagnosis not present

## 2023-05-31 NOTE — Telephone Encounter (Signed)
Called patient.  No answer. LMOV to call back.   Debbe Odea, MD  You5 minutes ago (4:04 PM)    Okay to order echocardiogram and stress echo for pretransplant workup.  I have not seen patient in about a year, please schedule follow-up appointment with either myself or APP.  Thank you

## 2023-05-31 NOTE — Telephone Encounter (Signed)
Incoming fax from [facility] requesting we schedule an Echo and Stress Echo for patient as a pre op to remain on the kidney transplant list.

## 2023-06-01 DIAGNOSIS — N186 End stage renal disease: Secondary | ICD-10-CM | POA: Diagnosis not present

## 2023-06-01 DIAGNOSIS — Z992 Dependence on renal dialysis: Secondary | ICD-10-CM | POA: Diagnosis not present

## 2023-06-03 DIAGNOSIS — N186 End stage renal disease: Secondary | ICD-10-CM | POA: Diagnosis not present

## 2023-06-03 DIAGNOSIS — Z992 Dependence on renal dialysis: Secondary | ICD-10-CM | POA: Diagnosis not present

## 2023-06-04 DIAGNOSIS — N186 End stage renal disease: Secondary | ICD-10-CM | POA: Diagnosis not present

## 2023-06-04 DIAGNOSIS — Z992 Dependence on renal dialysis: Secondary | ICD-10-CM | POA: Diagnosis not present

## 2023-06-05 DIAGNOSIS — N186 End stage renal disease: Secondary | ICD-10-CM | POA: Diagnosis not present

## 2023-06-05 DIAGNOSIS — Z992 Dependence on renal dialysis: Secondary | ICD-10-CM | POA: Diagnosis not present

## 2023-06-06 DIAGNOSIS — Z992 Dependence on renal dialysis: Secondary | ICD-10-CM | POA: Diagnosis not present

## 2023-06-06 DIAGNOSIS — N186 End stage renal disease: Secondary | ICD-10-CM | POA: Diagnosis not present

## 2023-06-07 DIAGNOSIS — N186 End stage renal disease: Secondary | ICD-10-CM | POA: Diagnosis not present

## 2023-06-07 DIAGNOSIS — Z992 Dependence on renal dialysis: Secondary | ICD-10-CM | POA: Diagnosis not present

## 2023-06-08 DIAGNOSIS — N186 End stage renal disease: Secondary | ICD-10-CM | POA: Diagnosis not present

## 2023-06-08 DIAGNOSIS — Z992 Dependence on renal dialysis: Secondary | ICD-10-CM | POA: Diagnosis not present

## 2023-06-10 DIAGNOSIS — N186 End stage renal disease: Secondary | ICD-10-CM | POA: Diagnosis not present

## 2023-06-10 DIAGNOSIS — Z992 Dependence on renal dialysis: Secondary | ICD-10-CM | POA: Diagnosis not present

## 2023-06-11 DIAGNOSIS — Z992 Dependence on renal dialysis: Secondary | ICD-10-CM | POA: Diagnosis not present

## 2023-06-11 DIAGNOSIS — N186 End stage renal disease: Secondary | ICD-10-CM | POA: Diagnosis not present

## 2023-06-12 DIAGNOSIS — N186 End stage renal disease: Secondary | ICD-10-CM | POA: Diagnosis not present

## 2023-06-12 DIAGNOSIS — Z992 Dependence on renal dialysis: Secondary | ICD-10-CM | POA: Diagnosis not present

## 2023-06-13 DIAGNOSIS — Z992 Dependence on renal dialysis: Secondary | ICD-10-CM | POA: Diagnosis not present

## 2023-06-13 DIAGNOSIS — N186 End stage renal disease: Secondary | ICD-10-CM | POA: Diagnosis not present

## 2023-06-14 DIAGNOSIS — Z992 Dependence on renal dialysis: Secondary | ICD-10-CM | POA: Diagnosis not present

## 2023-06-14 DIAGNOSIS — N186 End stage renal disease: Secondary | ICD-10-CM | POA: Diagnosis not present

## 2023-06-15 ENCOUNTER — Telehealth: Payer: Self-pay | Admitting: Cardiology

## 2023-06-15 DIAGNOSIS — Z992 Dependence on renal dialysis: Secondary | ICD-10-CM | POA: Diagnosis not present

## 2023-06-15 DIAGNOSIS — N186 End stage renal disease: Secondary | ICD-10-CM | POA: Diagnosis not present

## 2023-06-15 NOTE — Telephone Encounter (Signed)
Patient called stating she would like for an stress test to be ordered.

## 2023-06-15 NOTE — Telephone Encounter (Signed)
Returned the call to the patient. She was calling to get the yearly echo stress test for her transplant ordered and scheduled.

## 2023-06-17 DIAGNOSIS — N186 End stage renal disease: Secondary | ICD-10-CM | POA: Diagnosis not present

## 2023-06-17 DIAGNOSIS — Z992 Dependence on renal dialysis: Secondary | ICD-10-CM | POA: Diagnosis not present

## 2023-06-18 DIAGNOSIS — N186 End stage renal disease: Secondary | ICD-10-CM | POA: Diagnosis not present

## 2023-06-18 DIAGNOSIS — Z992 Dependence on renal dialysis: Secondary | ICD-10-CM | POA: Diagnosis not present

## 2023-06-18 NOTE — Progress Notes (Unsigned)
Cardiology Office Note    Date:  06/18/2023   ID:  Erin Crawford, DOB 15-Mar-1961, MRN [id number]  PCP:  Myrene Buddy, NP  Cardiologist:  Debbe Odea, MD  Electrophysiologist:  None   Chief Complaint: ***  History of Present Illness:   Erin Crawford is a 61 y.o. female with history of ***  ***   Labs independently reviewed: 02/2023 - A1c 7.1 01/2023 - BUN 57, serum creatinine 8.26, albumin 4.0, AST/ALT normal, PLT 286 10/2022 - Hgb 11.4 05/2022 - TC 153, TG 80, HDL 36, LDL 101 02/2018 - TSH normal  Past Medical History:  Diagnosis Date   Anemia    Chronic kidney disease    Diabetes mellitus without complication (HCC)    Hypercholesteremia    Hypertension    LGSIL (low grade squamous intraepithelial dysplasia)    Menopause    Peritoneal dialysis catheter in place [facility])    Sleep apnea    CPAP - (uses sporadically)   Wears dentures    partial lower    Past Surgical History:  Procedure Laterality Date   COLONOSCOPY WITH PROPOFOL N/A 10/07/2019   Procedure: COLONOSCOPY WITH BIOPSY;  Surgeon: Pasty Spillers, MD;  Location: [facility];  Service: Endoscopy;  Laterality: N/A;  Diabetic - insulin sleep apnea   COLONOSCOPY WITH PROPOFOL N/A 06/15/2021   Procedure: COLONOSCOPY WITH PROPOFOL;  Surgeon: Pasty Spillers, MD;  Location: [facility];  Service: Endoscopy;  Laterality: N/A;   COLPOSCOPY     ESOPHAGOGASTRODUODENOSCOPY (EGD) WITH PROPOFOL N/A 10/07/2019   Procedure: ESOPHAGOGASTRODUODENOSCOPY (EGD) WITH BIOPSY;  Surgeon: Pasty Spillers, MD;  Location: [facility];  Service: Endoscopy;  Laterality: N/A;   POLYPECTOMY N/A 10/07/2019   Procedure: POLYPECTOMY;  Surgeon: Pasty Spillers, MD;  Location: [facility];  Service: Endoscopy;  Laterality: N/A;   TUBAL LIGATION      Current Medications: No outpatient medications have been marked as taking for the 06/21/23 encounter (Appointment) with Sondra Barges, PA-C.    Allergies:   Patient has no known allergies.   Social History   Socioeconomic History   Marital status: Married    Spouse name: Not on file   Number of children: Not on file   Years of education: Not on file   Highest education level: Not on file  Occupational History   Not on file  Tobacco Use   Smoking status: Never   Smokeless tobacco: Never  Vaping Use   Vaping status: Never Used  Substance and Sexual Activity   Alcohol use: No   Drug use: No   Sexual activity: Yes    Birth control/protection: None  Other Topics Concern   Not on file  Social History Narrative   Not on file   Social Determinants of Health   Financial Resource Strain: Not on file  Food Insecurity: No Food Insecurity (02/01/2023)   Received from [facility], [facility]   Hunger Vital Sign    Worried About Running Out of Food in the Last Year: Never true    Ran Out of Food in the Last Year: Never true  Transportation Needs: No Transportation Needs (02/01/2023)   Received from [facility], [facility]   PRAPARE - Transportation    Lack of Transportation (Medical): No    Lack of Transportation (Non-Medical): No  Physical Activity: Not on file  Stress: Not on file  Social Connections: Not on file     Family History:  The  patient's family history includes Cancer in her father and mother; Cirrhosis in her father; Diabetes in her maternal grandmother. There is no history of Breast cancer.  ROS:   12-point review of systems is negative unless otherwise noted in the HPI.   EKGs/Labs/Other Studies Reviewed:    Studies reviewed were summarized above. The additional studies were reviewed today:  ETT 06/29/2022: IMPRESSIONS   1. This is a negative stress echocardiogram for ischemia.   2. This is a low risk study.  __________  2D echo 06/29/2022: 1. Left ventricular ejection fraction, by estimation, is 65 to 70%. The  left ventricle has normal function. The left ventricle has no regional   wall motion abnormalities. There is mild concentric left ventricular  hypertrophy. Left ventricular diastolic  parameters are consistent with Grade I diastolic dysfunction (impaired  relaxation). Elevated left atrial pressure.   2. Right ventricular systolic function is normal. The right ventricular  size is normal. There is normal pulmonary artery systolic pressure. The  estimated right ventricular systolic pressure is 26.4 mmHg.   3. Left atrial size was moderately dilated.   4. A small pericardial effusion is present. The pericardial effusion is  circumferential. There is no evidence of cardiac tamponade.   5. The mitral valve is degenerative. Mild mitral valve regurgitation. No  evidence of mitral stenosis. Moderate mitral annular calcification.   6. The aortic valve is tricuspid. There is mild calcification of the  aortic valve. There is mild thickening of the aortic valve. Aortic valve  regurgitation is not visualized. Mild aortic valve stenosis. Aortic valve  area, by VTI measures 1.56 cm.  Aortic valve mean gradient measures 13.0 mmHg. Aortic valve Vmax measures  2.29 m/s.   7. The inferior vena cava is normal in size with greater than 50%  respiratory variability, suggesting right atrial pressure of 3 mmHg.  __________  Stress echo 05/20/2021 ([facility]): STRESS ECG RESULTS   ECG Results: Normal. .   INTERPRETATION    INDETERMINATE.   NO DOPPLER PERFORMED FOR VALVULAR REGURGITATION   NO DOPPLER PERFORMED FOR VALVULAR STENOSIS   Note: NO WALL MOTION ABNORMALITIES AT REST OR SUBTARGET HEART RATE OF 113   bpm   Maximum workload of  7.00 METs was achieved during exercise.   RESTING HYPERTENSION - APPROPRIATE RESPONSE   TRIVIAL PERICARDIAL EFFUSION  __________  2D echo 05/20/2021 ([facility]): INTERPRETATION    NORMAL LEFT VENTRICULAR SYSTOLIC FUNCTION WITH MODERATE LVH    NORMAL RIGHT VENTRICULAR SYSTOLIC FUNCTION    VALVULAR REGURGITATION: TRIVIAL PR, MILD TR    NO VALVULAR  STENOSIS    TRIVIAL PERICARDIAL EFFUSION  __________  Carotid artery ultrasound 05/20/2021 ([facility]): Findings:  RIGHT:  On the right, there is no significant plaque.  Evidence of hemodynamically significant stenosis in the internal carotid  artery: None.   Flow in the right vertebral artery is antegrade.   LEFT:  On the left, there is no significant plaque.  Evidence of hemodynamically significant stenosis in the internal carotid  artery: None.   Flow in the left vertebral artery is antegrade.   Impression:  No evidence of hemodynamically significant stenosis in the bilateral  internal carotid arteries.  __________  Luci Bank patch 01/2021: Patient had a min HR of 50 bpm, max HR of 150 bpm, and avg HR of 72 bpm. Predominant underlying rhythm was Sinus Rhythm. 3 paroxysmal supraventricular Tachycardia runs occurred, the run with the fastest interval lasting 5 beats with a max rate of 150 bpm, the  longest lasting  10.0 secs with an avg rate of 131 bpm. Isolated SVEs were rare (<1.0%), SVE Couplets were rare (<1.0%), and SVE Triplets were rare  (<1.0%). Isolated VEs were rare (<1.0%), VE Couplets were rare (<1.0%), and no VE Triplets were present. Ventricular Bigeminy was present. __________  2D echo 08/14/2016 Jerline Pain): INTERPRETATION  NORMAL LEFT VENTRICULAR SYSTOLIC FUNCTION   WITH MODERATE LVH  NORMAL RIGHT VENTRICULAR SYSTOLIC FUNCTION  MILD VALVULAR REGURGITATION (See above)  NO VALVULAR STENOSIS  EF >55%  __________  2D echo 08/05/2015 Gavin Potters): INTERPRETATION  NORMAL LEFT VENTRICULAR SYSTOLIC FUNCTION  NORMAL RIGHT VENTRICULAR SYSTOLIC FUNCTION  MILD VALVULAR REGURGITATION (See above)  NO VALVULAR STENOSIS    EKG:  EKG is ordered today.  The EKG ordered today demonstrates ***  Recent Labs: 07/04/2022: Potassium 3.5  Recent Lipid Panel    Component Value Date/Time   CHOL 153 06/19/2022 1034   CHOL 289 (H) 08/17/2015 1030   CHOL 210 (H) 01/09/2014 1333   TRIG 80  06/19/2022 1034   TRIG 150 01/09/2014 1333   HDL 36 (L) 06/19/2022 1034   HDL 49 08/17/2015 1030   HDL 45 01/09/2014 1333   CHOLHDL 4.3 06/19/2022 1034   VLDL 16 06/19/2022 1034   VLDL 30 01/09/2014 1333   LDLCALC 101 (H) 06/19/2022 1034   LDLCALC 198 (H) 08/17/2015 1030   LDLCALC 135 (H) 01/09/2014 1333    PHYSICAL EXAM:    VS:  There were no vitals taken for this visit.  BMI: There is no height or weight on file to calculate BMI.  Physical Exam  Wt Readings from Last 3 Encounters:  07/03/22 180 lb 8 oz (81.9 kg)  06/02/22 180 lb (81.6 kg)  06/20/21 199 lb 1.6 oz (90.3 kg)     ASSESSMENT & PLAN:   ***   {Are you ordering a CV Procedure (e.g. stress test, cath, DCCV, TEE, etc)?   Press F2        :[id number]}     Disposition: F/u with Dr. Azucena Cecil or an APP in ***.   Medication Adjustments/Labs and Tests Ordered: Current medicines are reviewed at length with the patient today.  Concerns regarding medicines are outlined above. Medication changes, Labs and Tests ordered today are summarized above and listed in the Patient Instructions accessible in Encounters.   Elinor Dodge, PA-C 06/18/2023 3:00 PM     [facility] [address] [place], [place] [address] [phone]

## 2023-06-19 DIAGNOSIS — Z992 Dependence on renal dialysis: Secondary | ICD-10-CM | POA: Diagnosis not present

## 2023-06-19 DIAGNOSIS — N186 End stage renal disease: Secondary | ICD-10-CM | POA: Diagnosis not present

## 2023-06-20 DIAGNOSIS — N186 End stage renal disease: Secondary | ICD-10-CM | POA: Diagnosis not present

## 2023-06-20 DIAGNOSIS — Z992 Dependence on renal dialysis: Secondary | ICD-10-CM | POA: Diagnosis not present

## 2023-06-21 ENCOUNTER — Telehealth: Payer: Self-pay | Admitting: Physician Assistant

## 2023-06-21 ENCOUNTER — Ambulatory Visit: Payer: Commercial Managed Care - PPO | Attending: Physician Assistant | Admitting: Cardiovascular Disease

## 2023-06-21 ENCOUNTER — Encounter: Payer: Self-pay | Admitting: Cardiovascular Disease

## 2023-06-21 VITALS — BP 138/62 | HR 69 | Ht 62.0 in | Wt 175.4 lb

## 2023-06-21 DIAGNOSIS — E7800 Pure hypercholesterolemia, unspecified: Secondary | ICD-10-CM

## 2023-06-21 DIAGNOSIS — I10 Essential (primary) hypertension: Secondary | ICD-10-CM

## 2023-06-21 DIAGNOSIS — N184 Chronic kidney disease, stage 4 (severe): Secondary | ICD-10-CM

## 2023-06-21 DIAGNOSIS — Z992 Dependence on renal dialysis: Secondary | ICD-10-CM | POA: Diagnosis not present

## 2023-06-21 DIAGNOSIS — N186 End stage renal disease: Secondary | ICD-10-CM | POA: Diagnosis not present

## 2023-06-21 DIAGNOSIS — Z01818 Encounter for other preprocedural examination: Secondary | ICD-10-CM

## 2023-06-21 DIAGNOSIS — R011 Cardiac murmur, unspecified: Secondary | ICD-10-CM

## 2023-06-21 DIAGNOSIS — G4733 Obstructive sleep apnea (adult) (pediatric): Secondary | ICD-10-CM

## 2023-06-21 MED ORDER — ROSUVASTATIN CALCIUM 40 MG PO TABS: 40.0000 mg | ORAL_TABLET | Freq: Every day | ORAL | 3 refills | Status: AC

## 2023-06-21 MED ORDER — HYDRALAZINE HCL 25 MG PO TABS: 25.0000 mg | ORAL_TABLET | Freq: Three times a day (TID) | ORAL | 3 refills | Status: AC

## 2023-06-21 MED ORDER — FUROSEMIDE 80 MG PO TABS: 80.0000 mg | ORAL_TABLET | Freq: Every day | ORAL | 3 refills | Status: AC

## 2023-06-21 MED ORDER — CLONIDINE HCL 0.1 MG PO TABS: 0.1000 mg | ORAL_TABLET | Freq: Every day | ORAL | 3 refills | Status: AC

## 2023-06-21 MED ORDER — CARVEDILOL 12.5 MG PO TABS: 12.5000 mg | ORAL_TABLET | Freq: Two times a day (BID) | ORAL | 3 refills | Status: AC

## 2023-06-21 MED ORDER — LOSARTAN POTASSIUM 100 MG PO TABS: 100.0000 mg | ORAL_TABLET | Freq: Every day | ORAL | 3 refills | Status: AC

## 2023-06-21 MED ORDER — AMLODIPINE BESYLATE 10 MG PO TABS: 10.0000 mg | ORAL_TABLET | Freq: Every day | ORAL | 3 refills | Status: AC

## 2023-06-21 NOTE — Telephone Encounter (Signed)
*  left voicemail to reschedule appt on 9/26 due to provider being out of office.

## 2023-06-21 NOTE — Patient Instructions (Addendum)
Medication Instructions:  No changes  If you need a refill on your cardiac medications before your next appointment, please call your pharmacy.   Lab work: No new labs needed  Testing/Procedures: Your provider has ordered a Lexiscan/ Exercise Myoview Stress test. This will take place at [facility]. Please report to the [facility] medical mall entrance. The volunteers at the first desk will direct you where to go.  [facility] MYOVIEW  Your provider has ordered a Stress Test with nuclear imaging. The purpose of this test is to evaluate the blood supply to your heart muscle. This procedure is referred to as a "Non-Invasive Stress Test." This is because other than having an IV started in your vein, nothing is inserted or "invades" your body. Cardiac stress tests are done to find areas of poor blood flow to the heart by determining the extent of coronary artery disease (CAD). Some patients exercise on a treadmill, which naturally increases the blood flow to your heart, while others who are unable to walk on a treadmill due to physical limitations will have a pharmacologic/chemical stress agent called Lexiscan . This medicine will mimic walking on a treadmill by temporarily increasing your coronary blood flow.   Please note: these test may take anywhere between 2-4 hours to complete  How to prepare for your Myoview test:  Nothing to eat for 6 hours prior to the test No caffeine for 24 hours prior to test No smoking 24 hours prior to test. Your medication may be taken with water.  If your doctor stopped a medication because of this test, do not take that medication. Ladies, please do not wear dresses.  Skirts or pants are appropriate. Please wear a short sleeve shirt. No perfume, cologne or lotion. Wear comfortable walking shoes. No heels!   PLEASE NOTIFY THE OFFICE AT LEAST 24 HOURS IN ADVANCE IF YOU ARE UNABLE TO KEEP YOUR APPOINTMENT.  [phone] AND  PLEASE NOTIFY NUCLEAR MEDICINE AT [facility] AT LEAST 24 HOURS  IN ADVANCE IF YOU ARE UNABLE TO KEEP YOUR APPOINTMENT. [phone]   Your physician has requested that you have an echocardiogram. Echocardiography is a painless test that uses sound waves to create images of your heart. It provides your doctor with information about the size and shape of your heart and how well your heart's chambers and valves are working.   You may receive an ultrasound enhancing agent through an IV if needed to better visualize your heart during the echo. This procedure takes approximately one hour.  There are no restrictions for this procedure.  This will take place at [address], [place] [address]   Follow-Up: At [facility], you and your health needs are our priority.  As part of our continuing mission to provide you with exceptional heart care, we have created designated Provider Care Teams.  These Care Teams include your primary Cardiologist (physician) and Advanced Practice Providers (APPs -  Physician Assistants and Nurse Practitioners) who all work together to provide you with the care you need, when you need it.  You will need a follow up appointment in 12 months with Dr. Azucena Cecil  Providers on your designated Care Team:   Nicolasa Ducking, NP Eula Listen, PA-C Cadence Fransico Michael, [place]  COVID-19 Vaccine Information can be found at: [email or web address] For questions related to vaccine distribution or appointments, please email [email or web address] or call [phone].

## 2023-06-22 DIAGNOSIS — N186 End stage renal disease: Secondary | ICD-10-CM | POA: Diagnosis not present

## 2023-06-22 DIAGNOSIS — Z992 Dependence on renal dialysis: Secondary | ICD-10-CM | POA: Diagnosis not present

## 2023-06-24 DIAGNOSIS — N186 End stage renal disease: Secondary | ICD-10-CM | POA: Diagnosis not present

## 2023-06-24 DIAGNOSIS — Z992 Dependence on renal dialysis: Secondary | ICD-10-CM | POA: Diagnosis not present

## 2023-06-25 DIAGNOSIS — Z992 Dependence on renal dialysis: Secondary | ICD-10-CM | POA: Diagnosis not present

## 2023-06-25 DIAGNOSIS — N186 End stage renal disease: Secondary | ICD-10-CM | POA: Diagnosis not present

## 2023-06-26 DIAGNOSIS — N186 End stage renal disease: Secondary | ICD-10-CM | POA: Diagnosis not present

## 2023-06-26 DIAGNOSIS — Z992 Dependence on renal dialysis: Secondary | ICD-10-CM | POA: Diagnosis not present

## 2023-06-27 DIAGNOSIS — N186 End stage renal disease: Secondary | ICD-10-CM | POA: Diagnosis not present

## 2023-06-27 DIAGNOSIS — Z992 Dependence on renal dialysis: Secondary | ICD-10-CM | POA: Diagnosis not present

## 2023-06-28 DIAGNOSIS — Z992 Dependence on renal dialysis: Secondary | ICD-10-CM | POA: Diagnosis not present

## 2023-06-28 DIAGNOSIS — N186 End stage renal disease: Secondary | ICD-10-CM | POA: Diagnosis not present

## 2023-06-29 DIAGNOSIS — N186 End stage renal disease: Secondary | ICD-10-CM | POA: Diagnosis not present

## 2023-06-29 DIAGNOSIS — Z992 Dependence on renal dialysis: Secondary | ICD-10-CM | POA: Diagnosis not present

## 2023-07-01 DIAGNOSIS — Z992 Dependence on renal dialysis: Secondary | ICD-10-CM | POA: Diagnosis not present

## 2023-07-01 DIAGNOSIS — N186 End stage renal disease: Secondary | ICD-10-CM | POA: Diagnosis not present

## 2023-07-02 ENCOUNTER — Other Ambulatory Visit: Payer: Self-pay | Admitting: Cardiovascular Disease

## 2023-07-02 DIAGNOSIS — R011 Cardiac murmur, unspecified: Secondary | ICD-10-CM

## 2023-07-02 DIAGNOSIS — Z01818 Encounter for other preprocedural examination: Secondary | ICD-10-CM

## 2023-07-02 DIAGNOSIS — G4733 Obstructive sleep apnea (adult) (pediatric): Secondary | ICD-10-CM

## 2023-07-02 DIAGNOSIS — N186 End stage renal disease: Secondary | ICD-10-CM | POA: Diagnosis not present

## 2023-07-02 DIAGNOSIS — N184 Chronic kidney disease, stage 4 (severe): Secondary | ICD-10-CM

## 2023-07-02 DIAGNOSIS — Z992 Dependence on renal dialysis: Secondary | ICD-10-CM | POA: Diagnosis not present

## 2023-07-02 DIAGNOSIS — E7800 Pure hypercholesterolemia, unspecified: Secondary | ICD-10-CM

## 2023-07-02 DIAGNOSIS — I10 Essential (primary) hypertension: Secondary | ICD-10-CM

## 2023-07-03 DIAGNOSIS — Z992 Dependence on renal dialysis: Secondary | ICD-10-CM | POA: Diagnosis not present

## 2023-07-03 DIAGNOSIS — N186 End stage renal disease: Secondary | ICD-10-CM | POA: Diagnosis not present

## 2023-07-04 ENCOUNTER — Ambulatory Visit: Payer: Commercial Managed Care - PPO | Attending: Cardiovascular Disease

## 2023-07-04 DIAGNOSIS — R011 Cardiac murmur, unspecified: Secondary | ICD-10-CM | POA: Diagnosis not present

## 2023-07-04 DIAGNOSIS — N186 End stage renal disease: Secondary | ICD-10-CM | POA: Diagnosis not present

## 2023-07-04 DIAGNOSIS — Z992 Dependence on renal dialysis: Secondary | ICD-10-CM | POA: Diagnosis not present

## 2023-07-04 LAB — ECHOCARDIOGRAM COMPLETE
AR max vel: 1.36 cm2
AV Area VTI: 1.47 cm2
AV Area mean vel: 1.36 cm2
AV Mean grad: 5 mm[Hg]
AV Peak grad: 8.8 mm[Hg]
Ao pk vel: 1.48 m/s
Area-P 1/2: 2.54 cm2
Calc EF: 52.7 %
S' Lateral: 3.1 cm
Single Plane A2C EF: 49.6 %
Single Plane A4C EF: 55.5 %

## 2023-07-05 DIAGNOSIS — Z992 Dependence on renal dialysis: Secondary | ICD-10-CM | POA: Diagnosis not present

## 2023-07-05 DIAGNOSIS — N186 End stage renal disease: Secondary | ICD-10-CM | POA: Diagnosis not present

## 2023-07-06 DIAGNOSIS — N186 End stage renal disease: Secondary | ICD-10-CM | POA: Diagnosis not present

## 2023-07-06 DIAGNOSIS — Z992 Dependence on renal dialysis: Secondary | ICD-10-CM | POA: Diagnosis not present

## 2023-07-08 DIAGNOSIS — Z992 Dependence on renal dialysis: Secondary | ICD-10-CM | POA: Diagnosis not present

## 2023-07-08 DIAGNOSIS — N186 End stage renal disease: Secondary | ICD-10-CM | POA: Diagnosis not present

## 2023-07-09 ENCOUNTER — Ambulatory Visit
Admission: RE | Admit: 2023-07-09 | Discharge: 2023-07-09 | Disposition: A | Discharge status: Home / Self Care | Payer: Medicare PPO | Source: Ambulatory Visit | Attending: Cardiovascular Disease | Admitting: Cardiovascular Disease

## 2023-07-09 DIAGNOSIS — N186 End stage renal disease: Secondary | ICD-10-CM | POA: Insufficient documentation

## 2023-07-09 DIAGNOSIS — Z01810 Encounter for preprocedural cardiovascular examination: Secondary | ICD-10-CM

## 2023-07-09 DIAGNOSIS — Z992 Dependence on renal dialysis: Secondary | ICD-10-CM | POA: Diagnosis not present

## 2023-07-09 DIAGNOSIS — I120 Hypertensive chronic kidney disease with stage 5 chronic kidney disease or end stage renal disease: Secondary | ICD-10-CM | POA: Insufficient documentation

## 2023-07-09 DIAGNOSIS — Z01818 Encounter for other preprocedural examination: Secondary | ICD-10-CM | POA: Diagnosis not present

## 2023-07-09 DIAGNOSIS — I517 Cardiomegaly: Secondary | ICD-10-CM | POA: Diagnosis not present

## 2023-07-09 DIAGNOSIS — I2510 Atherosclerotic heart disease of native coronary artery without angina pectoris: Secondary | ICD-10-CM | POA: Diagnosis not present

## 2023-07-09 DIAGNOSIS — E1122 Type 2 diabetes mellitus with diabetic chronic kidney disease: Secondary | ICD-10-CM | POA: Diagnosis not present

## 2023-07-09 LAB — NM MYOCAR MULTI W/SPECT W/WALL MOTION / EF
LV dias vol: 46 mL (ref 46–106)
LV sys vol: 21 mL
Nuc Stress EF: 54 %
Peak HR: 96 {beats}/min
Rest HR: 63 {beats}/min
Rest Nuclear Isotope Dose: 10.8 mCi
SDS: 4
SRS: 11
SSS: 15
ST Depression (mm): 0 mm
Stress Nuclear Isotope Dose: 31.8 mCi
TID: 0.9

## 2023-07-09 MED ORDER — REGADENOSON 0.4 MG/5ML IV SOLN
0.4000 mg | Freq: Once | INTRAVENOUS | Status: AC
  Administered 2023-07-09: 0.4 mg via INTRAVENOUS

## 2023-07-09 MED ORDER — TECHNETIUM TC 99M TETROFOSMIN IV KIT
31.7700 | PACK | Freq: Once | INTRAVENOUS | Status: AC | PRN
  Administered 2023-07-09: 31.77 via INTRAVENOUS

## 2023-07-09 MED ORDER — TECHNETIUM TC 99M TETROFOSMIN IV KIT
10.8300 | PACK | Freq: Once | INTRAVENOUS | Status: AC | PRN
  Administered 2023-07-09: 10.83 via INTRAVENOUS

## 2023-07-10 DIAGNOSIS — N186 End stage renal disease: Secondary | ICD-10-CM | POA: Diagnosis not present

## 2023-07-10 DIAGNOSIS — Z992 Dependence on renal dialysis: Secondary | ICD-10-CM | POA: Diagnosis not present

## 2023-07-11 DIAGNOSIS — Z992 Dependence on renal dialysis: Secondary | ICD-10-CM | POA: Diagnosis not present

## 2023-07-11 DIAGNOSIS — N186 End stage renal disease: Secondary | ICD-10-CM | POA: Diagnosis not present

## 2023-07-12 DIAGNOSIS — N186 End stage renal disease: Secondary | ICD-10-CM | POA: Diagnosis not present

## 2023-07-12 DIAGNOSIS — Z992 Dependence on renal dialysis: Secondary | ICD-10-CM | POA: Diagnosis not present

## 2023-07-13 DIAGNOSIS — Z992 Dependence on renal dialysis: Secondary | ICD-10-CM | POA: Diagnosis not present

## 2023-07-13 DIAGNOSIS — N186 End stage renal disease: Secondary | ICD-10-CM | POA: Diagnosis not present

## 2023-07-15 DIAGNOSIS — Z992 Dependence on renal dialysis: Secondary | ICD-10-CM | POA: Diagnosis not present

## 2023-07-15 DIAGNOSIS — N186 End stage renal disease: Secondary | ICD-10-CM | POA: Diagnosis not present

## 2023-07-16 ENCOUNTER — Encounter: Payer: Self-pay | Admitting: Emergency Medicine

## 2023-07-16 DIAGNOSIS — N186 End stage renal disease: Secondary | ICD-10-CM | POA: Diagnosis not present

## 2023-07-16 DIAGNOSIS — Z992 Dependence on renal dialysis: Secondary | ICD-10-CM | POA: Diagnosis not present

## 2023-07-17 DIAGNOSIS — Z992 Dependence on renal dialysis: Secondary | ICD-10-CM | POA: Diagnosis not present

## 2023-07-17 DIAGNOSIS — N186 End stage renal disease: Secondary | ICD-10-CM | POA: Diagnosis not present

## 2023-07-18 DIAGNOSIS — Z992 Dependence on renal dialysis: Secondary | ICD-10-CM | POA: Diagnosis not present

## 2023-07-18 DIAGNOSIS — N186 End stage renal disease: Secondary | ICD-10-CM | POA: Diagnosis not present

## 2023-07-19 DIAGNOSIS — Z992 Dependence on renal dialysis: Secondary | ICD-10-CM | POA: Diagnosis not present

## 2023-07-19 DIAGNOSIS — N186 End stage renal disease: Secondary | ICD-10-CM | POA: Diagnosis not present

## 2023-07-20 DIAGNOSIS — N186 End stage renal disease: Secondary | ICD-10-CM | POA: Diagnosis not present

## 2023-07-20 DIAGNOSIS — Z992 Dependence on renal dialysis: Secondary | ICD-10-CM | POA: Diagnosis not present

## 2023-07-22 DIAGNOSIS — Z992 Dependence on renal dialysis: Secondary | ICD-10-CM | POA: Diagnosis not present

## 2023-07-22 DIAGNOSIS — N186 End stage renal disease: Secondary | ICD-10-CM | POA: Diagnosis not present

## 2023-07-23 DIAGNOSIS — Z992 Dependence on renal dialysis: Secondary | ICD-10-CM | POA: Diagnosis not present

## 2023-07-23 DIAGNOSIS — N186 End stage renal disease: Secondary | ICD-10-CM | POA: Diagnosis not present

## 2023-07-25 DIAGNOSIS — N186 End stage renal disease: Secondary | ICD-10-CM | POA: Diagnosis not present

## 2023-07-25 DIAGNOSIS — Z992 Dependence on renal dialysis: Secondary | ICD-10-CM | POA: Diagnosis not present

## 2023-07-26 DIAGNOSIS — Z992 Dependence on renal dialysis: Secondary | ICD-10-CM | POA: Diagnosis not present

## 2023-07-26 DIAGNOSIS — N186 End stage renal disease: Secondary | ICD-10-CM | POA: Diagnosis not present

## 2023-07-27 DIAGNOSIS — Z992 Dependence on renal dialysis: Secondary | ICD-10-CM | POA: Diagnosis not present

## 2023-07-27 DIAGNOSIS — N186 End stage renal disease: Secondary | ICD-10-CM | POA: Diagnosis not present

## 2023-07-29 DIAGNOSIS — N186 End stage renal disease: Secondary | ICD-10-CM | POA: Diagnosis not present

## 2023-07-29 DIAGNOSIS — Z992 Dependence on renal dialysis: Secondary | ICD-10-CM | POA: Diagnosis not present

## 2023-07-30 DIAGNOSIS — Z992 Dependence on renal dialysis: Secondary | ICD-10-CM | POA: Diagnosis not present

## 2023-07-30 DIAGNOSIS — N186 End stage renal disease: Secondary | ICD-10-CM | POA: Diagnosis not present

## 2023-07-31 DIAGNOSIS — N186 End stage renal disease: Secondary | ICD-10-CM | POA: Diagnosis not present

## 2023-08-01 DIAGNOSIS — N186 End stage renal disease: Secondary | ICD-10-CM | POA: Diagnosis not present

## 2023-08-01 DIAGNOSIS — Z992 Dependence on renal dialysis: Secondary | ICD-10-CM | POA: Diagnosis not present

## 2023-08-02 DIAGNOSIS — N186 End stage renal disease: Secondary | ICD-10-CM | POA: Diagnosis not present

## 2023-08-02 DIAGNOSIS — Z992 Dependence on renal dialysis: Secondary | ICD-10-CM | POA: Diagnosis not present

## 2023-08-03 DIAGNOSIS — Z992 Dependence on renal dialysis: Secondary | ICD-10-CM | POA: Diagnosis not present

## 2023-08-03 DIAGNOSIS — N186 End stage renal disease: Secondary | ICD-10-CM | POA: Diagnosis not present

## 2023-08-05 DIAGNOSIS — Z992 Dependence on renal dialysis: Secondary | ICD-10-CM | POA: Diagnosis not present

## 2023-08-05 DIAGNOSIS — N186 End stage renal disease: Secondary | ICD-10-CM | POA: Diagnosis not present

## 2023-08-06 DIAGNOSIS — Z992 Dependence on renal dialysis: Secondary | ICD-10-CM | POA: Diagnosis not present

## 2023-08-06 DIAGNOSIS — N186 End stage renal disease: Secondary | ICD-10-CM | POA: Diagnosis not present

## 2023-08-08 DIAGNOSIS — Z992 Dependence on renal dialysis: Secondary | ICD-10-CM | POA: Diagnosis not present

## 2023-08-08 DIAGNOSIS — N186 End stage renal disease: Secondary | ICD-10-CM | POA: Diagnosis not present

## 2023-08-09 DIAGNOSIS — N186 End stage renal disease: Secondary | ICD-10-CM | POA: Diagnosis not present

## 2023-08-09 DIAGNOSIS — Z992 Dependence on renal dialysis: Secondary | ICD-10-CM | POA: Diagnosis not present

## 2023-08-10 DIAGNOSIS — N186 End stage renal disease: Secondary | ICD-10-CM | POA: Diagnosis not present

## 2023-08-10 DIAGNOSIS — Z992 Dependence on renal dialysis: Secondary | ICD-10-CM | POA: Diagnosis not present

## 2023-08-12 DIAGNOSIS — Z992 Dependence on renal dialysis: Secondary | ICD-10-CM | POA: Diagnosis not present

## 2023-08-12 DIAGNOSIS — N186 End stage renal disease: Secondary | ICD-10-CM | POA: Diagnosis not present

## 2023-08-13 DIAGNOSIS — Z992 Dependence on renal dialysis: Secondary | ICD-10-CM | POA: Diagnosis not present

## 2023-08-13 DIAGNOSIS — N186 End stage renal disease: Secondary | ICD-10-CM | POA: Diagnosis not present

## 2023-08-15 DIAGNOSIS — Z992 Dependence on renal dialysis: Secondary | ICD-10-CM | POA: Diagnosis not present

## 2023-08-15 DIAGNOSIS — N186 End stage renal disease: Secondary | ICD-10-CM | POA: Diagnosis not present

## 2023-08-16 DIAGNOSIS — N186 End stage renal disease: Secondary | ICD-10-CM | POA: Diagnosis not present

## 2023-08-16 DIAGNOSIS — Z992 Dependence on renal dialysis: Secondary | ICD-10-CM | POA: Diagnosis not present

## 2023-08-17 DIAGNOSIS — Z992 Dependence on renal dialysis: Secondary | ICD-10-CM | POA: Diagnosis not present

## 2023-08-17 DIAGNOSIS — N186 End stage renal disease: Secondary | ICD-10-CM | POA: Diagnosis not present

## 2023-08-19 DIAGNOSIS — N186 End stage renal disease: Secondary | ICD-10-CM | POA: Diagnosis not present

## 2023-08-19 DIAGNOSIS — Z992 Dependence on renal dialysis: Secondary | ICD-10-CM | POA: Diagnosis not present

## 2023-08-20 DIAGNOSIS — Z992 Dependence on renal dialysis: Secondary | ICD-10-CM | POA: Diagnosis not present

## 2023-08-20 DIAGNOSIS — N186 End stage renal disease: Secondary | ICD-10-CM | POA: Diagnosis not present

## 2023-08-22 DIAGNOSIS — N186 End stage renal disease: Secondary | ICD-10-CM | POA: Diagnosis not present

## 2023-08-22 DIAGNOSIS — Z992 Dependence on renal dialysis: Secondary | ICD-10-CM | POA: Diagnosis not present

## 2023-08-23 DIAGNOSIS — Z992 Dependence on renal dialysis: Secondary | ICD-10-CM | POA: Diagnosis not present

## 2023-08-23 DIAGNOSIS — N186 End stage renal disease: Secondary | ICD-10-CM | POA: Diagnosis not present

## 2023-08-24 DIAGNOSIS — N186 End stage renal disease: Secondary | ICD-10-CM | POA: Diagnosis not present

## 2023-08-24 DIAGNOSIS — Z992 Dependence on renal dialysis: Secondary | ICD-10-CM | POA: Diagnosis not present

## 2023-08-25 DIAGNOSIS — N186 End stage renal disease: Secondary | ICD-10-CM | POA: Diagnosis not present

## 2023-08-25 DIAGNOSIS — Z992 Dependence on renal dialysis: Secondary | ICD-10-CM | POA: Diagnosis not present

## 2023-08-26 DIAGNOSIS — N186 End stage renal disease: Secondary | ICD-10-CM | POA: Diagnosis not present

## 2023-08-26 DIAGNOSIS — Z992 Dependence on renal dialysis: Secondary | ICD-10-CM | POA: Diagnosis not present

## 2023-08-27 DIAGNOSIS — N186 End stage renal disease: Secondary | ICD-10-CM | POA: Diagnosis not present

## 2023-08-27 DIAGNOSIS — Z992 Dependence on renal dialysis: Secondary | ICD-10-CM | POA: Diagnosis not present

## 2023-08-28 DIAGNOSIS — Z7682 Awaiting organ transplant status: Secondary | ICD-10-CM | POA: Diagnosis not present

## 2023-08-28 DIAGNOSIS — N186 End stage renal disease: Secondary | ICD-10-CM | POA: Diagnosis not present

## 2023-08-29 DIAGNOSIS — N186 End stage renal disease: Secondary | ICD-10-CM | POA: Diagnosis not present

## 2023-08-29 DIAGNOSIS — Z992 Dependence on renal dialysis: Secondary | ICD-10-CM | POA: Diagnosis not present

## 2023-08-30 DIAGNOSIS — Z992 Dependence on renal dialysis: Secondary | ICD-10-CM | POA: Diagnosis not present

## 2023-08-30 DIAGNOSIS — N186 End stage renal disease: Secondary | ICD-10-CM | POA: Diagnosis not present

## 2023-08-31 DIAGNOSIS — N186 End stage renal disease: Secondary | ICD-10-CM | POA: Diagnosis not present

## 2023-08-31 DIAGNOSIS — Z992 Dependence on renal dialysis: Secondary | ICD-10-CM | POA: Diagnosis not present

## 2023-09-02 DIAGNOSIS — Z992 Dependence on renal dialysis: Secondary | ICD-10-CM | POA: Diagnosis not present

## 2023-09-02 DIAGNOSIS — N186 End stage renal disease: Secondary | ICD-10-CM | POA: Diagnosis not present

## 2023-09-03 DIAGNOSIS — Z992 Dependence on renal dialysis: Secondary | ICD-10-CM | POA: Diagnosis not present

## 2023-09-03 DIAGNOSIS — N186 End stage renal disease: Secondary | ICD-10-CM | POA: Diagnosis not present

## 2023-09-05 DIAGNOSIS — Z992 Dependence on renal dialysis: Secondary | ICD-10-CM | POA: Diagnosis not present

## 2023-09-05 DIAGNOSIS — N186 End stage renal disease: Secondary | ICD-10-CM | POA: Diagnosis not present

## 2023-09-06 DIAGNOSIS — N186 End stage renal disease: Secondary | ICD-10-CM | POA: Diagnosis not present

## 2023-09-06 DIAGNOSIS — Z992 Dependence on renal dialysis: Secondary | ICD-10-CM | POA: Diagnosis not present

## 2023-09-07 DIAGNOSIS — N186 End stage renal disease: Secondary | ICD-10-CM | POA: Diagnosis not present

## 2023-09-07 DIAGNOSIS — Z992 Dependence on renal dialysis: Secondary | ICD-10-CM | POA: Diagnosis not present

## 2023-09-09 DIAGNOSIS — Z992 Dependence on renal dialysis: Secondary | ICD-10-CM | POA: Diagnosis not present

## 2023-09-09 DIAGNOSIS — N186 End stage renal disease: Secondary | ICD-10-CM | POA: Diagnosis not present

## 2023-09-10 DIAGNOSIS — Z992 Dependence on renal dialysis: Secondary | ICD-10-CM | POA: Diagnosis not present

## 2023-09-10 DIAGNOSIS — N186 End stage renal disease: Secondary | ICD-10-CM | POA: Diagnosis not present

## 2023-09-12 DIAGNOSIS — Z992 Dependence on renal dialysis: Secondary | ICD-10-CM | POA: Diagnosis not present

## 2023-09-12 DIAGNOSIS — N186 End stage renal disease: Secondary | ICD-10-CM | POA: Diagnosis not present

## 2023-09-13 DIAGNOSIS — N186 End stage renal disease: Secondary | ICD-10-CM | POA: Diagnosis not present

## 2023-09-13 DIAGNOSIS — Z992 Dependence on renal dialysis: Secondary | ICD-10-CM | POA: Diagnosis not present

## 2023-09-14 DIAGNOSIS — Z992 Dependence on renal dialysis: Secondary | ICD-10-CM | POA: Diagnosis not present

## 2023-09-14 DIAGNOSIS — N186 End stage renal disease: Secondary | ICD-10-CM | POA: Diagnosis not present

## 2023-09-16 DIAGNOSIS — N186 End stage renal disease: Secondary | ICD-10-CM | POA: Diagnosis not present

## 2023-09-16 DIAGNOSIS — Z992 Dependence on renal dialysis: Secondary | ICD-10-CM | POA: Diagnosis not present

## 2023-09-17 DIAGNOSIS — N186 End stage renal disease: Secondary | ICD-10-CM | POA: Diagnosis not present

## 2023-09-17 DIAGNOSIS — Z992 Dependence on renal dialysis: Secondary | ICD-10-CM | POA: Diagnosis not present

## 2023-09-19 DIAGNOSIS — N186 End stage renal disease: Secondary | ICD-10-CM | POA: Diagnosis not present

## 2023-09-19 DIAGNOSIS — Z992 Dependence on renal dialysis: Secondary | ICD-10-CM | POA: Diagnosis not present

## 2023-09-20 DIAGNOSIS — Z992 Dependence on renal dialysis: Secondary | ICD-10-CM | POA: Diagnosis not present

## 2023-09-20 DIAGNOSIS — N186 End stage renal disease: Secondary | ICD-10-CM | POA: Diagnosis not present

## 2023-09-21 DIAGNOSIS — N186 End stage renal disease: Secondary | ICD-10-CM | POA: Diagnosis not present

## 2023-09-21 DIAGNOSIS — Z992 Dependence on renal dialysis: Secondary | ICD-10-CM | POA: Diagnosis not present

## 2023-09-23 DIAGNOSIS — N186 End stage renal disease: Secondary | ICD-10-CM | POA: Diagnosis not present

## 2023-09-23 DIAGNOSIS — Z992 Dependence on renal dialysis: Secondary | ICD-10-CM | POA: Diagnosis not present

## 2023-09-24 DIAGNOSIS — N186 End stage renal disease: Secondary | ICD-10-CM | POA: Diagnosis not present

## 2023-09-24 DIAGNOSIS — Z992 Dependence on renal dialysis: Secondary | ICD-10-CM | POA: Diagnosis not present

## 2023-09-25 DIAGNOSIS — N186 End stage renal disease: Secondary | ICD-10-CM | POA: Diagnosis not present

## 2023-09-25 DIAGNOSIS — Z992 Dependence on renal dialysis: Secondary | ICD-10-CM | POA: Diagnosis not present

## 2023-09-26 DIAGNOSIS — Z992 Dependence on renal dialysis: Secondary | ICD-10-CM | POA: Diagnosis not present

## 2023-09-26 DIAGNOSIS — N186 End stage renal disease: Secondary | ICD-10-CM | POA: Diagnosis not present

## 2023-09-27 DIAGNOSIS — N186 End stage renal disease: Secondary | ICD-10-CM | POA: Diagnosis not present

## 2023-09-27 DIAGNOSIS — Z992 Dependence on renal dialysis: Secondary | ICD-10-CM | POA: Diagnosis not present

## 2023-09-28 DIAGNOSIS — Z992 Dependence on renal dialysis: Secondary | ICD-10-CM | POA: Diagnosis not present

## 2023-09-28 DIAGNOSIS — N186 End stage renal disease: Secondary | ICD-10-CM | POA: Diagnosis not present

## 2023-09-30 DIAGNOSIS — Z992 Dependence on renal dialysis: Secondary | ICD-10-CM | POA: Diagnosis not present

## 2023-09-30 DIAGNOSIS — N186 End stage renal disease: Secondary | ICD-10-CM | POA: Diagnosis not present

## 2023-10-01 DIAGNOSIS — Z992 Dependence on renal dialysis: Secondary | ICD-10-CM | POA: Diagnosis not present

## 2023-10-01 DIAGNOSIS — N186 End stage renal disease: Secondary | ICD-10-CM | POA: Diagnosis not present

## 2023-10-03 DIAGNOSIS — H35033 Hypertensive retinopathy, bilateral: Secondary | ICD-10-CM | POA: Diagnosis not present

## 2023-10-03 DIAGNOSIS — N186 End stage renal disease: Secondary | ICD-10-CM | POA: Diagnosis not present

## 2023-10-03 DIAGNOSIS — Z992 Dependence on renal dialysis: Secondary | ICD-10-CM | POA: Diagnosis not present

## 2023-10-03 DIAGNOSIS — E113593 Type 2 diabetes mellitus with proliferative diabetic retinopathy without macular edema, bilateral: Secondary | ICD-10-CM | POA: Diagnosis not present

## 2023-10-03 DIAGNOSIS — H3582 Retinal ischemia: Secondary | ICD-10-CM | POA: Diagnosis not present

## 2023-10-03 DIAGNOSIS — H43812 Vitreous degeneration, left eye: Secondary | ICD-10-CM | POA: Diagnosis not present

## 2023-10-04 DIAGNOSIS — Z992 Dependence on renal dialysis: Secondary | ICD-10-CM | POA: Diagnosis not present

## 2023-10-04 DIAGNOSIS — N186 End stage renal disease: Secondary | ICD-10-CM | POA: Diagnosis not present

## 2023-10-05 DIAGNOSIS — N186 End stage renal disease: Secondary | ICD-10-CM | POA: Diagnosis not present

## 2023-10-05 DIAGNOSIS — Z992 Dependence on renal dialysis: Secondary | ICD-10-CM | POA: Diagnosis not present

## 2023-10-07 DIAGNOSIS — Z992 Dependence on renal dialysis: Secondary | ICD-10-CM | POA: Diagnosis not present

## 2023-10-07 DIAGNOSIS — N186 End stage renal disease: Secondary | ICD-10-CM | POA: Diagnosis not present

## 2023-10-08 DIAGNOSIS — N186 End stage renal disease: Secondary | ICD-10-CM | POA: Diagnosis not present

## 2023-10-08 DIAGNOSIS — Z992 Dependence on renal dialysis: Secondary | ICD-10-CM | POA: Diagnosis not present

## 2023-10-09 ENCOUNTER — Encounter: Payer: Self-pay | Admitting: Oncology

## 2023-10-10 DIAGNOSIS — Z992 Dependence on renal dialysis: Secondary | ICD-10-CM | POA: Diagnosis not present

## 2023-10-10 DIAGNOSIS — N186 End stage renal disease: Secondary | ICD-10-CM | POA: Diagnosis not present

## 2023-10-11 DIAGNOSIS — Z992 Dependence on renal dialysis: Secondary | ICD-10-CM | POA: Diagnosis not present

## 2023-10-11 DIAGNOSIS — N186 End stage renal disease: Secondary | ICD-10-CM | POA: Diagnosis not present

## 2023-10-12 DIAGNOSIS — Z992 Dependence on renal dialysis: Secondary | ICD-10-CM | POA: Diagnosis not present

## 2023-10-12 DIAGNOSIS — N186 End stage renal disease: Secondary | ICD-10-CM | POA: Diagnosis not present

## 2023-10-14 DIAGNOSIS — N186 End stage renal disease: Secondary | ICD-10-CM | POA: Diagnosis not present

## 2023-10-14 DIAGNOSIS — Z992 Dependence on renal dialysis: Secondary | ICD-10-CM | POA: Diagnosis not present

## 2023-10-15 DIAGNOSIS — Z992 Dependence on renal dialysis: Secondary | ICD-10-CM | POA: Diagnosis not present

## 2023-10-15 DIAGNOSIS — N186 End stage renal disease: Secondary | ICD-10-CM | POA: Diagnosis not present

## 2023-10-17 DIAGNOSIS — Z992 Dependence on renal dialysis: Secondary | ICD-10-CM | POA: Diagnosis not present

## 2023-10-17 DIAGNOSIS — N186 End stage renal disease: Secondary | ICD-10-CM | POA: Diagnosis not present

## 2023-10-18 DIAGNOSIS — N186 End stage renal disease: Secondary | ICD-10-CM | POA: Diagnosis not present

## 2023-10-18 DIAGNOSIS — Z992 Dependence on renal dialysis: Secondary | ICD-10-CM | POA: Diagnosis not present

## 2023-10-19 DIAGNOSIS — N186 End stage renal disease: Secondary | ICD-10-CM | POA: Diagnosis not present

## 2023-10-19 DIAGNOSIS — Z992 Dependence on renal dialysis: Secondary | ICD-10-CM | POA: Diagnosis not present

## 2023-10-21 DIAGNOSIS — N186 End stage renal disease: Secondary | ICD-10-CM | POA: Diagnosis not present

## 2023-10-21 DIAGNOSIS — Z992 Dependence on renal dialysis: Secondary | ICD-10-CM | POA: Diagnosis not present

## 2023-10-22 DIAGNOSIS — Z992 Dependence on renal dialysis: Secondary | ICD-10-CM | POA: Diagnosis not present

## 2023-10-22 DIAGNOSIS — N186 End stage renal disease: Secondary | ICD-10-CM | POA: Diagnosis not present

## 2023-10-24 DIAGNOSIS — Z992 Dependence on renal dialysis: Secondary | ICD-10-CM | POA: Diagnosis not present

## 2023-10-24 DIAGNOSIS — N186 End stage renal disease: Secondary | ICD-10-CM | POA: Diagnosis not present

## 2023-10-25 DIAGNOSIS — N186 End stage renal disease: Secondary | ICD-10-CM | POA: Diagnosis not present

## 2023-10-25 DIAGNOSIS — Z992 Dependence on renal dialysis: Secondary | ICD-10-CM | POA: Diagnosis not present

## 2023-10-26 DIAGNOSIS — N186 End stage renal disease: Secondary | ICD-10-CM | POA: Diagnosis not present

## 2023-10-26 DIAGNOSIS — Z992 Dependence on renal dialysis: Secondary | ICD-10-CM | POA: Diagnosis not present

## 2023-10-28 DIAGNOSIS — N186 End stage renal disease: Secondary | ICD-10-CM | POA: Diagnosis not present

## 2023-10-28 DIAGNOSIS — Z992 Dependence on renal dialysis: Secondary | ICD-10-CM | POA: Diagnosis not present

## 2023-10-29 DIAGNOSIS — Z992 Dependence on renal dialysis: Secondary | ICD-10-CM | POA: Diagnosis not present

## 2023-10-29 DIAGNOSIS — N186 End stage renal disease: Secondary | ICD-10-CM | POA: Diagnosis not present

## 2023-10-30 DIAGNOSIS — N186 End stage renal disease: Secondary | ICD-10-CM | POA: Diagnosis not present

## 2023-10-31 DIAGNOSIS — N186 End stage renal disease: Secondary | ICD-10-CM | POA: Diagnosis not present

## 2023-10-31 DIAGNOSIS — Z992 Dependence on renal dialysis: Secondary | ICD-10-CM | POA: Diagnosis not present

## 2023-11-01 DIAGNOSIS — Z992 Dependence on renal dialysis: Secondary | ICD-10-CM | POA: Diagnosis not present

## 2023-11-01 DIAGNOSIS — N186 End stage renal disease: Secondary | ICD-10-CM | POA: Diagnosis not present

## 2023-11-02 DIAGNOSIS — N186 End stage renal disease: Secondary | ICD-10-CM | POA: Diagnosis not present

## 2023-11-02 DIAGNOSIS — Z992 Dependence on renal dialysis: Secondary | ICD-10-CM | POA: Diagnosis not present

## 2023-11-04 DIAGNOSIS — Z992 Dependence on renal dialysis: Secondary | ICD-10-CM | POA: Diagnosis not present

## 2023-11-04 DIAGNOSIS — N186 End stage renal disease: Secondary | ICD-10-CM | POA: Diagnosis not present

## 2023-11-05 DIAGNOSIS — Z992 Dependence on renal dialysis: Secondary | ICD-10-CM | POA: Diagnosis not present

## 2023-11-05 DIAGNOSIS — E785 Hyperlipidemia, unspecified: Secondary | ICD-10-CM | POA: Diagnosis not present

## 2023-11-05 DIAGNOSIS — E1129 Type 2 diabetes mellitus with other diabetic kidney complication: Secondary | ICD-10-CM | POA: Diagnosis not present

## 2023-11-05 DIAGNOSIS — I152 Hypertension secondary to endocrine disorders: Secondary | ICD-10-CM | POA: Diagnosis not present

## 2023-11-05 DIAGNOSIS — R809 Proteinuria, unspecified: Secondary | ICD-10-CM | POA: Diagnosis not present

## 2023-11-05 DIAGNOSIS — Z794 Long term (current) use of insulin: Secondary | ICD-10-CM | POA: Diagnosis not present

## 2023-11-05 DIAGNOSIS — E1169 Type 2 diabetes mellitus with other specified complication: Secondary | ICD-10-CM | POA: Diagnosis not present

## 2023-11-05 DIAGNOSIS — E1159 Type 2 diabetes mellitus with other circulatory complications: Secondary | ICD-10-CM | POA: Diagnosis not present

## 2023-11-05 DIAGNOSIS — N186 End stage renal disease: Secondary | ICD-10-CM | POA: Diagnosis not present

## 2023-11-07 DIAGNOSIS — N186 End stage renal disease: Secondary | ICD-10-CM | POA: Diagnosis not present

## 2023-11-07 DIAGNOSIS — Z992 Dependence on renal dialysis: Secondary | ICD-10-CM | POA: Diagnosis not present

## 2023-11-08 DIAGNOSIS — N186 End stage renal disease: Secondary | ICD-10-CM | POA: Diagnosis not present

## 2023-11-08 DIAGNOSIS — Z992 Dependence on renal dialysis: Secondary | ICD-10-CM | POA: Diagnosis not present

## 2023-11-09 DIAGNOSIS — Z992 Dependence on renal dialysis: Secondary | ICD-10-CM | POA: Diagnosis not present

## 2023-11-09 DIAGNOSIS — N186 End stage renal disease: Secondary | ICD-10-CM | POA: Diagnosis not present

## 2023-11-11 DIAGNOSIS — Z992 Dependence on renal dialysis: Secondary | ICD-10-CM | POA: Diagnosis not present

## 2023-11-11 DIAGNOSIS — N186 End stage renal disease: Secondary | ICD-10-CM | POA: Diagnosis not present

## 2023-11-12 DIAGNOSIS — Z992 Dependence on renal dialysis: Secondary | ICD-10-CM | POA: Diagnosis not present

## 2023-11-12 DIAGNOSIS — N186 End stage renal disease: Secondary | ICD-10-CM | POA: Diagnosis not present

## 2023-11-14 DIAGNOSIS — Z992 Dependence on renal dialysis: Secondary | ICD-10-CM | POA: Diagnosis not present

## 2023-11-14 DIAGNOSIS — N186 End stage renal disease: Secondary | ICD-10-CM | POA: Diagnosis not present

## 2023-11-15 DIAGNOSIS — Z992 Dependence on renal dialysis: Secondary | ICD-10-CM | POA: Diagnosis not present

## 2023-11-15 DIAGNOSIS — N186 End stage renal disease: Secondary | ICD-10-CM | POA: Diagnosis not present

## 2023-11-16 DIAGNOSIS — Z992 Dependence on renal dialysis: Secondary | ICD-10-CM | POA: Diagnosis not present

## 2023-11-16 DIAGNOSIS — N186 End stage renal disease: Secondary | ICD-10-CM | POA: Diagnosis not present

## 2023-11-18 DIAGNOSIS — N186 End stage renal disease: Secondary | ICD-10-CM | POA: Diagnosis not present

## 2023-11-18 DIAGNOSIS — Z992 Dependence on renal dialysis: Secondary | ICD-10-CM | POA: Diagnosis not present

## 2023-11-19 DIAGNOSIS — N186 End stage renal disease: Secondary | ICD-10-CM | POA: Diagnosis not present

## 2023-11-19 DIAGNOSIS — Z992 Dependence on renal dialysis: Secondary | ICD-10-CM | POA: Diagnosis not present

## 2023-11-21 DIAGNOSIS — Z992 Dependence on renal dialysis: Secondary | ICD-10-CM | POA: Diagnosis not present

## 2023-11-21 DIAGNOSIS — N186 End stage renal disease: Secondary | ICD-10-CM | POA: Diagnosis not present

## 2023-11-22 DIAGNOSIS — N186 End stage renal disease: Secondary | ICD-10-CM | POA: Diagnosis not present

## 2023-11-22 DIAGNOSIS — Z992 Dependence on renal dialysis: Secondary | ICD-10-CM | POA: Diagnosis not present

## 2023-11-23 DIAGNOSIS — N186 End stage renal disease: Secondary | ICD-10-CM | POA: Diagnosis not present

## 2023-11-23 DIAGNOSIS — Z992 Dependence on renal dialysis: Secondary | ICD-10-CM | POA: Diagnosis not present

## 2023-11-25 DIAGNOSIS — N186 End stage renal disease: Secondary | ICD-10-CM | POA: Diagnosis not present

## 2023-11-25 DIAGNOSIS — Z992 Dependence on renal dialysis: Secondary | ICD-10-CM | POA: Diagnosis not present

## 2023-11-26 DIAGNOSIS — N186 End stage renal disease: Secondary | ICD-10-CM | POA: Diagnosis not present

## 2023-11-26 DIAGNOSIS — Z992 Dependence on renal dialysis: Secondary | ICD-10-CM | POA: Diagnosis not present

## 2023-11-26 DIAGNOSIS — Z7682 Awaiting organ transplant status: Secondary | ICD-10-CM | POA: Diagnosis not present

## 2023-11-28 DIAGNOSIS — N186 End stage renal disease: Secondary | ICD-10-CM | POA: Diagnosis not present

## 2023-11-28 DIAGNOSIS — Z992 Dependence on renal dialysis: Secondary | ICD-10-CM | POA: Diagnosis not present

## 2023-11-29 DIAGNOSIS — N186 End stage renal disease: Secondary | ICD-10-CM | POA: Diagnosis not present

## 2023-11-29 DIAGNOSIS — Z992 Dependence on renal dialysis: Secondary | ICD-10-CM | POA: Diagnosis not present

## 2023-11-30 DIAGNOSIS — Z992 Dependence on renal dialysis: Secondary | ICD-10-CM | POA: Diagnosis not present

## 2023-11-30 DIAGNOSIS — N186 End stage renal disease: Secondary | ICD-10-CM | POA: Diagnosis not present

## 2023-12-02 DIAGNOSIS — Z992 Dependence on renal dialysis: Secondary | ICD-10-CM | POA: Diagnosis not present

## 2023-12-02 DIAGNOSIS — N186 End stage renal disease: Secondary | ICD-10-CM | POA: Diagnosis not present

## 2023-12-03 DIAGNOSIS — N186 End stage renal disease: Secondary | ICD-10-CM | POA: Diagnosis not present

## 2023-12-03 DIAGNOSIS — Z992 Dependence on renal dialysis: Secondary | ICD-10-CM | POA: Diagnosis not present

## 2023-12-05 DIAGNOSIS — E113593 Type 2 diabetes mellitus with proliferative diabetic retinopathy without macular edema, bilateral: Secondary | ICD-10-CM | POA: Diagnosis not present

## 2023-12-05 DIAGNOSIS — Z992 Dependence on renal dialysis: Secondary | ICD-10-CM | POA: Diagnosis not present

## 2023-12-05 DIAGNOSIS — H43812 Vitreous degeneration, left eye: Secondary | ICD-10-CM | POA: Diagnosis not present

## 2023-12-05 DIAGNOSIS — H35033 Hypertensive retinopathy, bilateral: Secondary | ICD-10-CM | POA: Diagnosis not present

## 2023-12-05 DIAGNOSIS — H3582 Retinal ischemia: Secondary | ICD-10-CM | POA: Diagnosis not present

## 2023-12-05 DIAGNOSIS — N186 End stage renal disease: Secondary | ICD-10-CM | POA: Diagnosis not present

## 2023-12-06 DIAGNOSIS — N186 End stage renal disease: Secondary | ICD-10-CM | POA: Diagnosis not present

## 2023-12-06 DIAGNOSIS — Z992 Dependence on renal dialysis: Secondary | ICD-10-CM | POA: Diagnosis not present

## 2023-12-07 DIAGNOSIS — N186 End stage renal disease: Secondary | ICD-10-CM | POA: Diagnosis not present

## 2023-12-07 DIAGNOSIS — Z992 Dependence on renal dialysis: Secondary | ICD-10-CM | POA: Diagnosis not present

## 2023-12-09 DIAGNOSIS — N186 End stage renal disease: Secondary | ICD-10-CM | POA: Diagnosis not present

## 2023-12-09 DIAGNOSIS — Z992 Dependence on renal dialysis: Secondary | ICD-10-CM | POA: Diagnosis not present

## 2023-12-10 DIAGNOSIS — Z992 Dependence on renal dialysis: Secondary | ICD-10-CM | POA: Diagnosis not present

## 2023-12-10 DIAGNOSIS — N186 End stage renal disease: Secondary | ICD-10-CM | POA: Diagnosis not present

## 2023-12-12 DIAGNOSIS — Z992 Dependence on renal dialysis: Secondary | ICD-10-CM | POA: Diagnosis not present

## 2023-12-12 DIAGNOSIS — N186 End stage renal disease: Secondary | ICD-10-CM | POA: Diagnosis not present

## 2023-12-13 DIAGNOSIS — N186 End stage renal disease: Secondary | ICD-10-CM | POA: Diagnosis not present

## 2023-12-13 DIAGNOSIS — Z992 Dependence on renal dialysis: Secondary | ICD-10-CM | POA: Diagnosis not present

## 2023-12-14 DIAGNOSIS — Z992 Dependence on renal dialysis: Secondary | ICD-10-CM | POA: Diagnosis not present

## 2023-12-14 DIAGNOSIS — N186 End stage renal disease: Secondary | ICD-10-CM | POA: Diagnosis not present

## 2023-12-16 DIAGNOSIS — N186 End stage renal disease: Secondary | ICD-10-CM | POA: Diagnosis not present

## 2023-12-16 DIAGNOSIS — Z992 Dependence on renal dialysis: Secondary | ICD-10-CM | POA: Diagnosis not present

## 2023-12-17 DIAGNOSIS — Z992 Dependence on renal dialysis: Secondary | ICD-10-CM | POA: Diagnosis not present

## 2023-12-17 DIAGNOSIS — N186 End stage renal disease: Secondary | ICD-10-CM | POA: Diagnosis not present

## 2023-12-18 DIAGNOSIS — Z992 Dependence on renal dialysis: Secondary | ICD-10-CM | POA: Diagnosis not present

## 2023-12-18 DIAGNOSIS — N186 End stage renal disease: Secondary | ICD-10-CM | POA: Diagnosis not present

## 2023-12-20 DIAGNOSIS — Z01818 Encounter for other preprocedural examination: Secondary | ICD-10-CM | POA: Diagnosis not present

## 2023-12-20 DIAGNOSIS — Z992 Dependence on renal dialysis: Secondary | ICD-10-CM | POA: Diagnosis not present

## 2023-12-20 DIAGNOSIS — I120 Hypertensive chronic kidney disease with stage 5 chronic kidney disease or end stage renal disease: Secondary | ICD-10-CM | POA: Diagnosis not present

## 2023-12-20 DIAGNOSIS — N186 End stage renal disease: Secondary | ICD-10-CM | POA: Diagnosis not present

## 2023-12-20 DIAGNOSIS — E1122 Type 2 diabetes mellitus with diabetic chronic kidney disease: Secondary | ICD-10-CM | POA: Diagnosis not present

## 2023-12-21 DIAGNOSIS — N186 End stage renal disease: Secondary | ICD-10-CM | POA: Diagnosis not present

## 2023-12-21 DIAGNOSIS — Z992 Dependence on renal dialysis: Secondary | ICD-10-CM | POA: Diagnosis not present

## 2023-12-23 DIAGNOSIS — Z992 Dependence on renal dialysis: Secondary | ICD-10-CM | POA: Diagnosis not present

## 2023-12-23 DIAGNOSIS — N186 End stage renal disease: Secondary | ICD-10-CM | POA: Diagnosis not present

## 2023-12-24 DIAGNOSIS — Z992 Dependence on renal dialysis: Secondary | ICD-10-CM | POA: Diagnosis not present

## 2023-12-24 DIAGNOSIS — N186 End stage renal disease: Secondary | ICD-10-CM | POA: Diagnosis not present

## 2023-12-25 DIAGNOSIS — Z992 Dependence on renal dialysis: Secondary | ICD-10-CM | POA: Diagnosis not present

## 2023-12-25 DIAGNOSIS — N186 End stage renal disease: Secondary | ICD-10-CM | POA: Diagnosis not present

## 2023-12-27 DIAGNOSIS — N186 End stage renal disease: Secondary | ICD-10-CM | POA: Diagnosis not present

## 2023-12-27 DIAGNOSIS — Z992 Dependence on renal dialysis: Secondary | ICD-10-CM | POA: Diagnosis not present

## 2023-12-28 DIAGNOSIS — Z992 Dependence on renal dialysis: Secondary | ICD-10-CM | POA: Diagnosis not present

## 2023-12-28 DIAGNOSIS — N186 End stage renal disease: Secondary | ICD-10-CM | POA: Diagnosis not present

## 2023-12-30 DIAGNOSIS — N186 End stage renal disease: Secondary | ICD-10-CM | POA: Diagnosis not present

## 2023-12-30 DIAGNOSIS — Z992 Dependence on renal dialysis: Secondary | ICD-10-CM | POA: Diagnosis not present

## 2023-12-31 DIAGNOSIS — Z992 Dependence on renal dialysis: Secondary | ICD-10-CM | POA: Diagnosis not present

## 2023-12-31 DIAGNOSIS — N186 End stage renal disease: Secondary | ICD-10-CM | POA: Diagnosis not present

## 2024-01-01 DIAGNOSIS — Z992 Dependence on renal dialysis: Secondary | ICD-10-CM | POA: Diagnosis not present

## 2024-01-01 DIAGNOSIS — N186 End stage renal disease: Secondary | ICD-10-CM | POA: Diagnosis not present

## 2024-01-02 DIAGNOSIS — E113592 Type 2 diabetes mellitus with proliferative diabetic retinopathy without macular edema, left eye: Secondary | ICD-10-CM | POA: Diagnosis not present

## 2024-01-03 DIAGNOSIS — N186 End stage renal disease: Secondary | ICD-10-CM | POA: Diagnosis not present

## 2024-01-03 DIAGNOSIS — Z992 Dependence on renal dialysis: Secondary | ICD-10-CM | POA: Diagnosis not present

## 2024-01-04 DIAGNOSIS — Z992 Dependence on renal dialysis: Secondary | ICD-10-CM | POA: Diagnosis not present

## 2024-01-04 DIAGNOSIS — N186 End stage renal disease: Secondary | ICD-10-CM | POA: Diagnosis not present

## 2024-01-06 DIAGNOSIS — Z992 Dependence on renal dialysis: Secondary | ICD-10-CM | POA: Diagnosis not present

## 2024-01-06 DIAGNOSIS — N186 End stage renal disease: Secondary | ICD-10-CM | POA: Diagnosis not present

## 2024-01-07 DIAGNOSIS — N186 End stage renal disease: Secondary | ICD-10-CM | POA: Diagnosis not present

## 2024-01-07 DIAGNOSIS — Z992 Dependence on renal dialysis: Secondary | ICD-10-CM | POA: Diagnosis not present

## 2024-01-08 DIAGNOSIS — N186 End stage renal disease: Secondary | ICD-10-CM | POA: Diagnosis not present

## 2024-01-08 DIAGNOSIS — Z992 Dependence on renal dialysis: Secondary | ICD-10-CM | POA: Diagnosis not present

## 2024-01-10 DIAGNOSIS — Z992 Dependence on renal dialysis: Secondary | ICD-10-CM | POA: Diagnosis not present

## 2024-01-10 DIAGNOSIS — N186 End stage renal disease: Secondary | ICD-10-CM | POA: Diagnosis not present

## 2024-01-11 DIAGNOSIS — N186 End stage renal disease: Secondary | ICD-10-CM | POA: Diagnosis not present

## 2024-01-11 DIAGNOSIS — Z992 Dependence on renal dialysis: Secondary | ICD-10-CM | POA: Diagnosis not present

## 2024-01-13 DIAGNOSIS — Z992 Dependence on renal dialysis: Secondary | ICD-10-CM | POA: Diagnosis not present

## 2024-01-13 DIAGNOSIS — N186 End stage renal disease: Secondary | ICD-10-CM | POA: Diagnosis not present

## 2024-01-14 DIAGNOSIS — N186 End stage renal disease: Secondary | ICD-10-CM | POA: Diagnosis not present

## 2024-01-14 DIAGNOSIS — Z992 Dependence on renal dialysis: Secondary | ICD-10-CM | POA: Diagnosis not present

## 2024-01-15 DIAGNOSIS — Z992 Dependence on renal dialysis: Secondary | ICD-10-CM | POA: Diagnosis not present

## 2024-01-15 DIAGNOSIS — N186 End stage renal disease: Secondary | ICD-10-CM | POA: Diagnosis not present

## 2024-01-17 DIAGNOSIS — Z992 Dependence on renal dialysis: Secondary | ICD-10-CM | POA: Diagnosis not present

## 2024-01-17 DIAGNOSIS — N186 End stage renal disease: Secondary | ICD-10-CM | POA: Diagnosis not present

## 2024-01-18 DIAGNOSIS — Z992 Dependence on renal dialysis: Secondary | ICD-10-CM | POA: Diagnosis not present

## 2024-01-18 DIAGNOSIS — N186 End stage renal disease: Secondary | ICD-10-CM | POA: Diagnosis not present

## 2024-01-20 DIAGNOSIS — Z992 Dependence on renal dialysis: Secondary | ICD-10-CM | POA: Diagnosis not present

## 2024-01-20 DIAGNOSIS — N186 End stage renal disease: Secondary | ICD-10-CM | POA: Diagnosis not present

## 2024-01-21 DIAGNOSIS — N186 End stage renal disease: Secondary | ICD-10-CM | POA: Diagnosis not present

## 2024-01-21 DIAGNOSIS — Z992 Dependence on renal dialysis: Secondary | ICD-10-CM | POA: Diagnosis not present

## 2024-01-22 DIAGNOSIS — N186 End stage renal disease: Secondary | ICD-10-CM | POA: Diagnosis not present

## 2024-01-22 DIAGNOSIS — Z992 Dependence on renal dialysis: Secondary | ICD-10-CM | POA: Diagnosis not present

## 2024-01-24 DIAGNOSIS — N186 End stage renal disease: Secondary | ICD-10-CM | POA: Diagnosis not present

## 2024-01-24 DIAGNOSIS — Z992 Dependence on renal dialysis: Secondary | ICD-10-CM | POA: Diagnosis not present

## 2024-01-25 DIAGNOSIS — N186 End stage renal disease: Secondary | ICD-10-CM | POA: Diagnosis not present

## 2024-01-25 DIAGNOSIS — Z992 Dependence on renal dialysis: Secondary | ICD-10-CM | POA: Diagnosis not present

## 2024-01-27 DIAGNOSIS — Z992 Dependence on renal dialysis: Secondary | ICD-10-CM | POA: Diagnosis not present

## 2024-01-27 DIAGNOSIS — N186 End stage renal disease: Secondary | ICD-10-CM | POA: Diagnosis not present

## 2024-01-28 DIAGNOSIS — Z992 Dependence on renal dialysis: Secondary | ICD-10-CM | POA: Diagnosis not present

## 2024-01-28 DIAGNOSIS — N186 End stage renal disease: Secondary | ICD-10-CM | POA: Diagnosis not present

## 2024-01-29 DIAGNOSIS — N186 End stage renal disease: Secondary | ICD-10-CM | POA: Diagnosis not present

## 2024-01-29 DIAGNOSIS — Z992 Dependence on renal dialysis: Secondary | ICD-10-CM | POA: Diagnosis not present

## 2024-01-31 DIAGNOSIS — Z992 Dependence on renal dialysis: Secondary | ICD-10-CM | POA: Diagnosis not present

## 2024-01-31 DIAGNOSIS — N186 End stage renal disease: Secondary | ICD-10-CM | POA: Diagnosis not present

## 2024-02-01 DIAGNOSIS — Z992 Dependence on renal dialysis: Secondary | ICD-10-CM | POA: Diagnosis not present

## 2024-02-01 DIAGNOSIS — N186 End stage renal disease: Secondary | ICD-10-CM | POA: Diagnosis not present

## 2024-02-03 DIAGNOSIS — Z992 Dependence on renal dialysis: Secondary | ICD-10-CM | POA: Diagnosis not present

## 2024-02-03 DIAGNOSIS — N186 End stage renal disease: Secondary | ICD-10-CM | POA: Diagnosis not present

## 2024-02-04 DIAGNOSIS — N186 End stage renal disease: Secondary | ICD-10-CM | POA: Diagnosis not present

## 2024-02-04 DIAGNOSIS — Z992 Dependence on renal dialysis: Secondary | ICD-10-CM | POA: Diagnosis not present

## 2024-02-05 DIAGNOSIS — H35033 Hypertensive retinopathy, bilateral: Secondary | ICD-10-CM | POA: Diagnosis not present

## 2024-02-05 DIAGNOSIS — Z992 Dependence on renal dialysis: Secondary | ICD-10-CM | POA: Diagnosis not present

## 2024-02-05 DIAGNOSIS — N186 End stage renal disease: Secondary | ICD-10-CM | POA: Diagnosis not present

## 2024-02-05 DIAGNOSIS — H43812 Vitreous degeneration, left eye: Secondary | ICD-10-CM | POA: Diagnosis not present

## 2024-02-05 DIAGNOSIS — E113593 Type 2 diabetes mellitus with proliferative diabetic retinopathy without macular edema, bilateral: Secondary | ICD-10-CM | POA: Diagnosis not present

## 2024-02-05 DIAGNOSIS — H3582 Retinal ischemia: Secondary | ICD-10-CM | POA: Diagnosis not present

## 2024-02-07 DIAGNOSIS — N186 End stage renal disease: Secondary | ICD-10-CM | POA: Diagnosis not present

## 2024-02-07 DIAGNOSIS — Z992 Dependence on renal dialysis: Secondary | ICD-10-CM | POA: Diagnosis not present

## 2024-02-08 DIAGNOSIS — N186 End stage renal disease: Secondary | ICD-10-CM | POA: Diagnosis not present

## 2024-02-08 DIAGNOSIS — Z992 Dependence on renal dialysis: Secondary | ICD-10-CM | POA: Diagnosis not present

## 2024-02-10 DIAGNOSIS — Z992 Dependence on renal dialysis: Secondary | ICD-10-CM | POA: Diagnosis not present

## 2024-02-10 DIAGNOSIS — N186 End stage renal disease: Secondary | ICD-10-CM | POA: Diagnosis not present

## 2024-02-11 DIAGNOSIS — Z992 Dependence on renal dialysis: Secondary | ICD-10-CM | POA: Diagnosis not present

## 2024-02-11 DIAGNOSIS — N186 End stage renal disease: Secondary | ICD-10-CM | POA: Diagnosis not present

## 2024-02-12 DIAGNOSIS — N186 End stage renal disease: Secondary | ICD-10-CM | POA: Diagnosis not present

## 2024-02-12 DIAGNOSIS — M47814 Spondylosis without myelopathy or radiculopathy, thoracic region: Secondary | ICD-10-CM | POA: Diagnosis not present

## 2024-02-12 DIAGNOSIS — Z992 Dependence on renal dialysis: Secondary | ICD-10-CM | POA: Diagnosis not present

## 2024-02-12 DIAGNOSIS — Z01818 Encounter for other preprocedural examination: Secondary | ICD-10-CM | POA: Diagnosis not present

## 2024-02-12 DIAGNOSIS — M461 Sacroiliitis, not elsewhere classified: Secondary | ICD-10-CM | POA: Diagnosis not present

## 2024-02-14 DIAGNOSIS — Z992 Dependence on renal dialysis: Secondary | ICD-10-CM | POA: Diagnosis not present

## 2024-02-14 DIAGNOSIS — N186 End stage renal disease: Secondary | ICD-10-CM | POA: Diagnosis not present

## 2024-02-15 DIAGNOSIS — Z992 Dependence on renal dialysis: Secondary | ICD-10-CM | POA: Diagnosis not present

## 2024-02-15 DIAGNOSIS — N186 End stage renal disease: Secondary | ICD-10-CM | POA: Diagnosis not present

## 2024-02-17 DIAGNOSIS — Z992 Dependence on renal dialysis: Secondary | ICD-10-CM | POA: Diagnosis not present

## 2024-02-17 DIAGNOSIS — N186 End stage renal disease: Secondary | ICD-10-CM | POA: Diagnosis not present

## 2024-02-18 DIAGNOSIS — Z992 Dependence on renal dialysis: Secondary | ICD-10-CM | POA: Diagnosis not present

## 2024-02-18 DIAGNOSIS — N186 End stage renal disease: Secondary | ICD-10-CM | POA: Diagnosis not present

## 2024-02-19 DIAGNOSIS — N186 End stage renal disease: Secondary | ICD-10-CM | POA: Diagnosis not present

## 2024-02-19 DIAGNOSIS — Z992 Dependence on renal dialysis: Secondary | ICD-10-CM | POA: Diagnosis not present

## 2024-02-21 DIAGNOSIS — Z992 Dependence on renal dialysis: Secondary | ICD-10-CM | POA: Diagnosis not present

## 2024-02-21 DIAGNOSIS — N186 End stage renal disease: Secondary | ICD-10-CM | POA: Diagnosis not present

## 2024-02-22 DIAGNOSIS — Z992 Dependence on renal dialysis: Secondary | ICD-10-CM | POA: Diagnosis not present

## 2024-02-22 DIAGNOSIS — N186 End stage renal disease: Secondary | ICD-10-CM | POA: Diagnosis not present

## 2024-02-24 DIAGNOSIS — Z992 Dependence on renal dialysis: Secondary | ICD-10-CM | POA: Diagnosis not present

## 2024-02-24 DIAGNOSIS — N186 End stage renal disease: Secondary | ICD-10-CM | POA: Diagnosis not present

## 2024-02-25 DIAGNOSIS — Z992 Dependence on renal dialysis: Secondary | ICD-10-CM | POA: Diagnosis not present

## 2024-02-25 DIAGNOSIS — N186 End stage renal disease: Secondary | ICD-10-CM | POA: Diagnosis not present

## 2024-02-26 DIAGNOSIS — Z992 Dependence on renal dialysis: Secondary | ICD-10-CM | POA: Diagnosis not present

## 2024-02-26 DIAGNOSIS — N186 End stage renal disease: Secondary | ICD-10-CM | POA: Diagnosis not present

## 2024-02-28 DIAGNOSIS — Z992 Dependence on renal dialysis: Secondary | ICD-10-CM | POA: Diagnosis not present

## 2024-02-28 DIAGNOSIS — N186 End stage renal disease: Secondary | ICD-10-CM | POA: Diagnosis not present

## 2024-02-29 DIAGNOSIS — N186 End stage renal disease: Secondary | ICD-10-CM | POA: Diagnosis not present

## 2024-02-29 DIAGNOSIS — Z992 Dependence on renal dialysis: Secondary | ICD-10-CM | POA: Diagnosis not present

## 2024-03-02 DIAGNOSIS — Z992 Dependence on renal dialysis: Secondary | ICD-10-CM | POA: Diagnosis not present

## 2024-03-02 DIAGNOSIS — N186 End stage renal disease: Secondary | ICD-10-CM | POA: Diagnosis not present

## 2024-03-03 DIAGNOSIS — N186 End stage renal disease: Secondary | ICD-10-CM | POA: Diagnosis not present

## 2024-03-03 DIAGNOSIS — Z992 Dependence on renal dialysis: Secondary | ICD-10-CM | POA: Diagnosis not present

## 2024-03-04 DIAGNOSIS — N186 End stage renal disease: Secondary | ICD-10-CM | POA: Diagnosis not present

## 2024-03-04 DIAGNOSIS — E113592 Type 2 diabetes mellitus with proliferative diabetic retinopathy without macular edema, left eye: Secondary | ICD-10-CM | POA: Diagnosis not present

## 2024-03-04 DIAGNOSIS — Z992 Dependence on renal dialysis: Secondary | ICD-10-CM | POA: Diagnosis not present

## 2024-03-06 DIAGNOSIS — N186 End stage renal disease: Secondary | ICD-10-CM | POA: Diagnosis not present

## 2024-03-06 DIAGNOSIS — Z992 Dependence on renal dialysis: Secondary | ICD-10-CM | POA: Diagnosis not present

## 2024-03-07 DIAGNOSIS — N186 End stage renal disease: Secondary | ICD-10-CM | POA: Diagnosis not present

## 2024-03-07 DIAGNOSIS — Z992 Dependence on renal dialysis: Secondary | ICD-10-CM | POA: Diagnosis not present

## 2024-03-09 DIAGNOSIS — Z992 Dependence on renal dialysis: Secondary | ICD-10-CM | POA: Diagnosis not present

## 2024-03-09 DIAGNOSIS — N186 End stage renal disease: Secondary | ICD-10-CM | POA: Diagnosis not present

## 2024-03-10 DIAGNOSIS — Z992 Dependence on renal dialysis: Secondary | ICD-10-CM | POA: Diagnosis not present

## 2024-03-10 DIAGNOSIS — N186 End stage renal disease: Secondary | ICD-10-CM | POA: Diagnosis not present

## 2024-03-11 DIAGNOSIS — Z992 Dependence on renal dialysis: Secondary | ICD-10-CM | POA: Diagnosis not present

## 2024-03-11 DIAGNOSIS — N186 End stage renal disease: Secondary | ICD-10-CM | POA: Diagnosis not present

## 2024-03-13 DIAGNOSIS — N186 End stage renal disease: Secondary | ICD-10-CM | POA: Diagnosis not present

## 2024-03-13 DIAGNOSIS — Z992 Dependence on renal dialysis: Secondary | ICD-10-CM | POA: Diagnosis not present

## 2024-03-14 DIAGNOSIS — Z992 Dependence on renal dialysis: Secondary | ICD-10-CM | POA: Diagnosis not present

## 2024-03-14 DIAGNOSIS — N186 End stage renal disease: Secondary | ICD-10-CM | POA: Diagnosis not present

## 2024-03-16 DIAGNOSIS — N186 End stage renal disease: Secondary | ICD-10-CM | POA: Diagnosis not present

## 2024-03-16 DIAGNOSIS — Z992 Dependence on renal dialysis: Secondary | ICD-10-CM | POA: Diagnosis not present

## 2024-03-17 DIAGNOSIS — N186 End stage renal disease: Secondary | ICD-10-CM | POA: Diagnosis not present

## 2024-03-17 DIAGNOSIS — Z992 Dependence on renal dialysis: Secondary | ICD-10-CM | POA: Diagnosis not present

## 2024-03-18 DIAGNOSIS — Z992 Dependence on renal dialysis: Secondary | ICD-10-CM | POA: Diagnosis not present

## 2024-03-18 DIAGNOSIS — N186 End stage renal disease: Secondary | ICD-10-CM | POA: Diagnosis not present

## 2024-03-20 DIAGNOSIS — Z992 Dependence on renal dialysis: Secondary | ICD-10-CM | POA: Diagnosis not present

## 2024-03-20 DIAGNOSIS — N186 End stage renal disease: Secondary | ICD-10-CM | POA: Diagnosis not present

## 2024-03-21 DIAGNOSIS — Z992 Dependence on renal dialysis: Secondary | ICD-10-CM | POA: Diagnosis not present

## 2024-03-21 DIAGNOSIS — N186 End stage renal disease: Secondary | ICD-10-CM | POA: Diagnosis not present

## 2024-03-23 DIAGNOSIS — Z992 Dependence on renal dialysis: Secondary | ICD-10-CM | POA: Diagnosis not present

## 2024-03-23 DIAGNOSIS — N186 End stage renal disease: Secondary | ICD-10-CM | POA: Diagnosis not present

## 2024-03-24 ENCOUNTER — Other Ambulatory Visit: Payer: Self-pay | Admitting: Nurse Practitioner

## 2024-03-24 DIAGNOSIS — Z1231 Encounter for screening mammogram for malignant neoplasm of breast: Secondary | ICD-10-CM

## 2024-03-24 DIAGNOSIS — Z992 Dependence on renal dialysis: Secondary | ICD-10-CM | POA: Diagnosis not present

## 2024-03-24 DIAGNOSIS — N186 End stage renal disease: Secondary | ICD-10-CM | POA: Diagnosis not present

## 2024-03-25 DIAGNOSIS — Z992 Dependence on renal dialysis: Secondary | ICD-10-CM | POA: Diagnosis not present

## 2024-03-25 DIAGNOSIS — N186 End stage renal disease: Secondary | ICD-10-CM | POA: Diagnosis not present

## 2024-03-27 DIAGNOSIS — N186 End stage renal disease: Secondary | ICD-10-CM | POA: Diagnosis not present

## 2024-03-27 DIAGNOSIS — Z992 Dependence on renal dialysis: Secondary | ICD-10-CM | POA: Diagnosis not present

## 2024-03-28 DIAGNOSIS — N186 End stage renal disease: Secondary | ICD-10-CM | POA: Diagnosis not present

## 2024-03-28 DIAGNOSIS — Z992 Dependence on renal dialysis: Secondary | ICD-10-CM | POA: Diagnosis not present

## 2024-03-29 DIAGNOSIS — Z01818 Encounter for other preprocedural examination: Secondary | ICD-10-CM | POA: Diagnosis not present

## 2024-03-30 DIAGNOSIS — N186 End stage renal disease: Secondary | ICD-10-CM | POA: Diagnosis not present

## 2024-03-30 DIAGNOSIS — Z992 Dependence on renal dialysis: Secondary | ICD-10-CM | POA: Diagnosis not present

## 2024-03-31 DIAGNOSIS — N186 End stage renal disease: Secondary | ICD-10-CM | POA: Diagnosis not present

## 2024-03-31 DIAGNOSIS — Z992 Dependence on renal dialysis: Secondary | ICD-10-CM | POA: Diagnosis not present

## 2024-04-01 DIAGNOSIS — Z992 Dependence on renal dialysis: Secondary | ICD-10-CM | POA: Diagnosis not present

## 2024-04-01 DIAGNOSIS — N186 End stage renal disease: Secondary | ICD-10-CM | POA: Diagnosis not present

## 2024-04-02 DIAGNOSIS — E119 Type 2 diabetes mellitus without complications: Secondary | ICD-10-CM | POA: Diagnosis not present

## 2024-04-02 DIAGNOSIS — Z992 Dependence on renal dialysis: Secondary | ICD-10-CM | POA: Diagnosis not present

## 2024-04-02 DIAGNOSIS — Z5181 Encounter for therapeutic drug level monitoring: Secondary | ICD-10-CM | POA: Diagnosis not present

## 2024-04-02 DIAGNOSIS — N186 End stage renal disease: Secondary | ICD-10-CM | POA: Diagnosis not present

## 2024-04-02 DIAGNOSIS — Z794 Long term (current) use of insulin: Secondary | ICD-10-CM | POA: Diagnosis not present

## 2024-04-02 DIAGNOSIS — Z79899 Other long term (current) drug therapy: Secondary | ICD-10-CM | POA: Diagnosis not present

## 2024-04-03 DIAGNOSIS — Z992 Dependence on renal dialysis: Secondary | ICD-10-CM | POA: Diagnosis not present

## 2024-04-03 DIAGNOSIS — N186 End stage renal disease: Secondary | ICD-10-CM | POA: Diagnosis not present

## 2024-04-04 DIAGNOSIS — N186 End stage renal disease: Secondary | ICD-10-CM | POA: Diagnosis not present

## 2024-04-04 DIAGNOSIS — Z992 Dependence on renal dialysis: Secondary | ICD-10-CM | POA: Diagnosis not present

## 2024-04-06 DIAGNOSIS — Z992 Dependence on renal dialysis: Secondary | ICD-10-CM | POA: Diagnosis not present

## 2024-04-06 DIAGNOSIS — N186 End stage renal disease: Secondary | ICD-10-CM | POA: Diagnosis not present

## 2024-04-07 DIAGNOSIS — N186 End stage renal disease: Secondary | ICD-10-CM | POA: Diagnosis not present

## 2024-04-07 DIAGNOSIS — Z992 Dependence on renal dialysis: Secondary | ICD-10-CM | POA: Diagnosis not present

## 2024-04-08 DIAGNOSIS — N186 End stage renal disease: Secondary | ICD-10-CM | POA: Diagnosis not present

## 2024-04-08 DIAGNOSIS — Z992 Dependence on renal dialysis: Secondary | ICD-10-CM | POA: Diagnosis not present

## 2024-04-10 DIAGNOSIS — Z992 Dependence on renal dialysis: Secondary | ICD-10-CM | POA: Diagnosis not present

## 2024-04-10 DIAGNOSIS — N186 End stage renal disease: Secondary | ICD-10-CM | POA: Diagnosis not present

## 2024-04-11 DIAGNOSIS — Z992 Dependence on renal dialysis: Secondary | ICD-10-CM | POA: Diagnosis not present

## 2024-04-11 DIAGNOSIS — N186 End stage renal disease: Secondary | ICD-10-CM | POA: Diagnosis not present

## 2024-04-13 DIAGNOSIS — Z992 Dependence on renal dialysis: Secondary | ICD-10-CM | POA: Diagnosis not present

## 2024-04-13 DIAGNOSIS — N186 End stage renal disease: Secondary | ICD-10-CM | POA: Diagnosis not present

## 2024-04-14 DIAGNOSIS — N186 End stage renal disease: Secondary | ICD-10-CM | POA: Diagnosis not present

## 2024-04-14 DIAGNOSIS — Z992 Dependence on renal dialysis: Secondary | ICD-10-CM | POA: Diagnosis not present

## 2024-04-15 DIAGNOSIS — Z992 Dependence on renal dialysis: Secondary | ICD-10-CM | POA: Diagnosis not present

## 2024-04-15 DIAGNOSIS — N186 End stage renal disease: Secondary | ICD-10-CM | POA: Diagnosis not present

## 2024-04-17 DIAGNOSIS — N186 End stage renal disease: Secondary | ICD-10-CM | POA: Diagnosis not present

## 2024-04-17 DIAGNOSIS — Z992 Dependence on renal dialysis: Secondary | ICD-10-CM | POA: Diagnosis not present

## 2024-04-18 DIAGNOSIS — N186 End stage renal disease: Secondary | ICD-10-CM | POA: Diagnosis not present

## 2024-04-18 DIAGNOSIS — Z992 Dependence on renal dialysis: Secondary | ICD-10-CM | POA: Diagnosis not present

## 2024-04-20 DIAGNOSIS — N186 End stage renal disease: Secondary | ICD-10-CM | POA: Diagnosis not present

## 2024-04-20 DIAGNOSIS — Z992 Dependence on renal dialysis: Secondary | ICD-10-CM | POA: Diagnosis not present

## 2024-04-21 DIAGNOSIS — N186 End stage renal disease: Secondary | ICD-10-CM | POA: Diagnosis not present

## 2024-04-21 DIAGNOSIS — Z992 Dependence on renal dialysis: Secondary | ICD-10-CM | POA: Diagnosis not present

## 2024-04-22 DIAGNOSIS — Z992 Dependence on renal dialysis: Secondary | ICD-10-CM | POA: Diagnosis not present

## 2024-04-22 DIAGNOSIS — N186 End stage renal disease: Secondary | ICD-10-CM | POA: Diagnosis not present

## 2024-04-24 DIAGNOSIS — Z992 Dependence on renal dialysis: Secondary | ICD-10-CM | POA: Diagnosis not present

## 2024-04-24 DIAGNOSIS — N186 End stage renal disease: Secondary | ICD-10-CM | POA: Diagnosis not present

## 2024-04-25 DIAGNOSIS — Z992 Dependence on renal dialysis: Secondary | ICD-10-CM | POA: Diagnosis not present

## 2024-04-25 DIAGNOSIS — N186 End stage renal disease: Secondary | ICD-10-CM | POA: Diagnosis not present

## 2024-04-27 DIAGNOSIS — N186 End stage renal disease: Secondary | ICD-10-CM | POA: Diagnosis not present

## 2024-04-27 DIAGNOSIS — Z992 Dependence on renal dialysis: Secondary | ICD-10-CM | POA: Diagnosis not present

## 2024-04-28 DIAGNOSIS — Z992 Dependence on renal dialysis: Secondary | ICD-10-CM | POA: Diagnosis not present

## 2024-04-28 DIAGNOSIS — N186 End stage renal disease: Secondary | ICD-10-CM | POA: Diagnosis not present

## 2024-04-29 DIAGNOSIS — Z992 Dependence on renal dialysis: Secondary | ICD-10-CM | POA: Diagnosis not present

## 2024-04-29 DIAGNOSIS — N186 End stage renal disease: Secondary | ICD-10-CM | POA: Diagnosis not present

## 2024-05-01 DIAGNOSIS — Z992 Dependence on renal dialysis: Secondary | ICD-10-CM | POA: Diagnosis not present

## 2024-05-01 DIAGNOSIS — N186 End stage renal disease: Secondary | ICD-10-CM | POA: Diagnosis not present

## 2024-05-02 DIAGNOSIS — N186 End stage renal disease: Secondary | ICD-10-CM | POA: Diagnosis not present

## 2024-05-02 DIAGNOSIS — Z992 Dependence on renal dialysis: Secondary | ICD-10-CM | POA: Diagnosis not present

## 2024-05-04 DIAGNOSIS — N186 End stage renal disease: Secondary | ICD-10-CM | POA: Diagnosis not present

## 2024-05-04 DIAGNOSIS — Z992 Dependence on renal dialysis: Secondary | ICD-10-CM | POA: Diagnosis not present

## 2024-05-05 DIAGNOSIS — E1169 Type 2 diabetes mellitus with other specified complication: Secondary | ICD-10-CM | POA: Diagnosis not present

## 2024-05-05 DIAGNOSIS — I152 Hypertension secondary to endocrine disorders: Secondary | ICD-10-CM | POA: Diagnosis not present

## 2024-05-05 DIAGNOSIS — E785 Hyperlipidemia, unspecified: Secondary | ICD-10-CM | POA: Diagnosis not present

## 2024-05-05 DIAGNOSIS — R809 Proteinuria, unspecified: Secondary | ICD-10-CM | POA: Diagnosis not present

## 2024-05-05 DIAGNOSIS — N186 End stage renal disease: Secondary | ICD-10-CM | POA: Diagnosis not present

## 2024-05-05 DIAGNOSIS — E1129 Type 2 diabetes mellitus with other diabetic kidney complication: Secondary | ICD-10-CM | POA: Diagnosis not present

## 2024-05-05 DIAGNOSIS — Z794 Long term (current) use of insulin: Secondary | ICD-10-CM | POA: Diagnosis not present

## 2024-05-05 DIAGNOSIS — E1159 Type 2 diabetes mellitus with other circulatory complications: Secondary | ICD-10-CM | POA: Diagnosis not present

## 2024-05-05 DIAGNOSIS — Z992 Dependence on renal dialysis: Secondary | ICD-10-CM | POA: Diagnosis not present

## 2024-05-06 DIAGNOSIS — Z992 Dependence on renal dialysis: Secondary | ICD-10-CM | POA: Diagnosis not present

## 2024-05-06 DIAGNOSIS — Z114 Encounter for screening for human immunodeficiency virus [HIV]: Secondary | ICD-10-CM | POA: Diagnosis not present

## 2024-05-06 DIAGNOSIS — E1159 Type 2 diabetes mellitus with other circulatory complications: Secondary | ICD-10-CM | POA: Diagnosis not present

## 2024-05-06 DIAGNOSIS — E1122 Type 2 diabetes mellitus with diabetic chronic kidney disease: Secondary | ICD-10-CM | POA: Diagnosis not present

## 2024-05-06 DIAGNOSIS — Z7682 Awaiting organ transplant status: Secondary | ICD-10-CM | POA: Diagnosis not present

## 2024-05-06 DIAGNOSIS — E1121 Type 2 diabetes mellitus with diabetic nephropathy: Secondary | ICD-10-CM | POA: Diagnosis not present

## 2024-05-06 DIAGNOSIS — N186 End stage renal disease: Secondary | ICD-10-CM | POA: Diagnosis not present

## 2024-05-06 DIAGNOSIS — Z1159 Encounter for screening for other viral diseases: Secondary | ICD-10-CM | POA: Diagnosis not present

## 2024-05-06 DIAGNOSIS — I152 Hypertension secondary to endocrine disorders: Secondary | ICD-10-CM | POA: Diagnosis not present

## 2024-05-08 DIAGNOSIS — Z992 Dependence on renal dialysis: Secondary | ICD-10-CM | POA: Diagnosis not present

## 2024-05-08 DIAGNOSIS — N186 End stage renal disease: Secondary | ICD-10-CM | POA: Diagnosis not present

## 2024-05-09 DIAGNOSIS — N186 End stage renal disease: Secondary | ICD-10-CM | POA: Diagnosis not present

## 2024-05-09 DIAGNOSIS — Z992 Dependence on renal dialysis: Secondary | ICD-10-CM | POA: Diagnosis not present

## 2024-05-11 DIAGNOSIS — N186 End stage renal disease: Secondary | ICD-10-CM | POA: Diagnosis not present

## 2024-05-11 DIAGNOSIS — Z992 Dependence on renal dialysis: Secondary | ICD-10-CM | POA: Diagnosis not present

## 2024-05-12 DIAGNOSIS — N186 End stage renal disease: Secondary | ICD-10-CM | POA: Diagnosis not present

## 2024-05-12 DIAGNOSIS — Z992 Dependence on renal dialysis: Secondary | ICD-10-CM | POA: Diagnosis not present

## 2024-05-13 DIAGNOSIS — Z992 Dependence on renal dialysis: Secondary | ICD-10-CM | POA: Diagnosis not present

## 2024-05-13 DIAGNOSIS — N186 End stage renal disease: Secondary | ICD-10-CM | POA: Diagnosis not present

## 2024-05-15 DIAGNOSIS — Z992 Dependence on renal dialysis: Secondary | ICD-10-CM | POA: Diagnosis not present

## 2024-05-15 DIAGNOSIS — N186 End stage renal disease: Secondary | ICD-10-CM | POA: Diagnosis not present

## 2024-05-16 DIAGNOSIS — Z992 Dependence on renal dialysis: Secondary | ICD-10-CM | POA: Diagnosis not present

## 2024-05-16 DIAGNOSIS — N186 End stage renal disease: Secondary | ICD-10-CM | POA: Diagnosis not present

## 2024-05-18 DIAGNOSIS — Z992 Dependence on renal dialysis: Secondary | ICD-10-CM | POA: Diagnosis not present

## 2024-05-18 DIAGNOSIS — N186 End stage renal disease: Secondary | ICD-10-CM | POA: Diagnosis not present

## 2024-05-19 DIAGNOSIS — N186 End stage renal disease: Secondary | ICD-10-CM | POA: Diagnosis not present

## 2024-05-19 DIAGNOSIS — Z992 Dependence on renal dialysis: Secondary | ICD-10-CM | POA: Diagnosis not present

## 2024-05-20 DIAGNOSIS — N186 End stage renal disease: Secondary | ICD-10-CM | POA: Diagnosis not present

## 2024-05-20 DIAGNOSIS — Z992 Dependence on renal dialysis: Secondary | ICD-10-CM | POA: Diagnosis not present

## 2024-05-21 ENCOUNTER — Ambulatory Visit
Admission: RE | Admit: 2024-05-21 | Discharge: 2024-05-21 | Disposition: A | Discharge status: Home / Self Care | Source: Ambulatory Visit | Attending: Nurse Practitioner | Admitting: Nurse Practitioner

## 2024-05-21 DIAGNOSIS — Z1231 Encounter for screening mammogram for malignant neoplasm of breast: Secondary | ICD-10-CM | POA: Diagnosis not present

## 2024-05-22 DIAGNOSIS — Z992 Dependence on renal dialysis: Secondary | ICD-10-CM | POA: Diagnosis not present

## 2024-05-22 DIAGNOSIS — N186 End stage renal disease: Secondary | ICD-10-CM | POA: Diagnosis not present

## 2024-05-23 DIAGNOSIS — Z992 Dependence on renal dialysis: Secondary | ICD-10-CM | POA: Diagnosis not present

## 2024-05-23 DIAGNOSIS — N186 End stage renal disease: Secondary | ICD-10-CM | POA: Diagnosis not present

## 2024-05-25 DIAGNOSIS — N186 End stage renal disease: Secondary | ICD-10-CM | POA: Diagnosis not present

## 2024-05-25 DIAGNOSIS — Z992 Dependence on renal dialysis: Secondary | ICD-10-CM | POA: Diagnosis not present

## 2024-05-26 DIAGNOSIS — Z992 Dependence on renal dialysis: Secondary | ICD-10-CM | POA: Diagnosis not present

## 2024-05-26 DIAGNOSIS — N186 End stage renal disease: Secondary | ICD-10-CM | POA: Diagnosis not present

## 2024-05-27 DIAGNOSIS — Z992 Dependence on renal dialysis: Secondary | ICD-10-CM | POA: Diagnosis not present

## 2024-05-27 DIAGNOSIS — N186 End stage renal disease: Secondary | ICD-10-CM | POA: Diagnosis not present

## 2024-05-29 DIAGNOSIS — N186 End stage renal disease: Secondary | ICD-10-CM | POA: Diagnosis not present

## 2024-05-29 DIAGNOSIS — Z992 Dependence on renal dialysis: Secondary | ICD-10-CM | POA: Diagnosis not present

## 2024-05-30 DIAGNOSIS — N186 End stage renal disease: Secondary | ICD-10-CM | POA: Diagnosis not present

## 2024-05-30 DIAGNOSIS — Z992 Dependence on renal dialysis: Secondary | ICD-10-CM | POA: Diagnosis not present

## 2024-06-01 DIAGNOSIS — N186 End stage renal disease: Secondary | ICD-10-CM | POA: Diagnosis not present

## 2024-06-01 DIAGNOSIS — Z992 Dependence on renal dialysis: Secondary | ICD-10-CM | POA: Diagnosis not present

## 2024-06-02 DIAGNOSIS — N186 End stage renal disease: Secondary | ICD-10-CM | POA: Diagnosis not present

## 2024-06-02 DIAGNOSIS — Z992 Dependence on renal dialysis: Secondary | ICD-10-CM | POA: Diagnosis not present

## 2024-06-02 DIAGNOSIS — E878 Other disorders of electrolyte and fluid balance, not elsewhere classified: Secondary | ICD-10-CM | POA: Diagnosis not present

## 2024-06-03 DIAGNOSIS — Z940 Kidney transplant status: Secondary | ICD-10-CM | POA: Diagnosis not present

## 2024-06-03 DIAGNOSIS — E1122 Type 2 diabetes mellitus with diabetic chronic kidney disease: Secondary | ICD-10-CM | POA: Diagnosis not present

## 2024-06-03 DIAGNOSIS — Z4822 Encounter for aftercare following kidney transplant: Secondary | ICD-10-CM | POA: Diagnosis not present

## 2024-06-03 DIAGNOSIS — S37099A Other injury of unspecified kidney, initial encounter: Secondary | ICD-10-CM | POA: Diagnosis not present

## 2024-06-03 DIAGNOSIS — D72829 Elevated white blood cell count, unspecified: Secondary | ICD-10-CM | POA: Diagnosis not present

## 2024-06-03 DIAGNOSIS — E119 Type 2 diabetes mellitus without complications: Secondary | ICD-10-CM | POA: Diagnosis not present

## 2024-06-03 DIAGNOSIS — Z992 Dependence on renal dialysis: Secondary | ICD-10-CM | POA: Diagnosis not present

## 2024-06-03 DIAGNOSIS — D84821 Immunodeficiency due to drugs: Secondary | ICD-10-CM | POA: Diagnosis not present

## 2024-06-03 DIAGNOSIS — R319 Hematuria, unspecified: Secondary | ICD-10-CM | POA: Diagnosis not present

## 2024-06-03 DIAGNOSIS — N189 Chronic kidney disease, unspecified: Secondary | ICD-10-CM | POA: Diagnosis not present

## 2024-06-03 DIAGNOSIS — N170 Acute kidney failure with tubular necrosis: Secondary | ICD-10-CM | POA: Diagnosis not present

## 2024-06-03 DIAGNOSIS — Z01818 Encounter for other preprocedural examination: Secondary | ICD-10-CM | POA: Diagnosis not present

## 2024-06-03 DIAGNOSIS — Z452 Encounter for adjustment and management of vascular access device: Secondary | ICD-10-CM | POA: Diagnosis not present

## 2024-06-03 DIAGNOSIS — I120 Hypertensive chronic kidney disease with stage 5 chronic kidney disease or end stage renal disease: Secondary | ICD-10-CM | POA: Diagnosis not present

## 2024-06-03 DIAGNOSIS — I517 Cardiomegaly: Secondary | ICD-10-CM | POA: Diagnosis not present

## 2024-06-03 DIAGNOSIS — Z794 Long term (current) use of insulin: Secondary | ICD-10-CM | POA: Diagnosis not present

## 2024-06-03 DIAGNOSIS — N186 End stage renal disease: Secondary | ICD-10-CM | POA: Diagnosis not present

## 2024-06-03 DIAGNOSIS — T85611A Breakdown (mechanical) of intraperitoneal dialysis catheter, initial encounter: Secondary | ICD-10-CM | POA: Diagnosis not present

## 2024-06-03 DIAGNOSIS — T8619 Other complication of kidney transplant: Secondary | ICD-10-CM | POA: Diagnosis not present

## 2024-06-03 DIAGNOSIS — I471 Supraventricular tachycardia, unspecified: Secondary | ICD-10-CM | POA: Diagnosis not present

## 2024-06-03 DIAGNOSIS — R918 Other nonspecific abnormal finding of lung field: Secondary | ICD-10-CM | POA: Diagnosis not present

## 2024-06-03 DIAGNOSIS — D696 Thrombocytopenia, unspecified: Secondary | ICD-10-CM | POA: Diagnosis not present

## 2024-06-03 DIAGNOSIS — Z4902 Encounter for fitting and adjustment of peritoneal dialysis catheter: Secondary | ICD-10-CM | POA: Diagnosis not present

## 2024-06-03 DIAGNOSIS — E1165 Type 2 diabetes mellitus with hyperglycemia: Secondary | ICD-10-CM | POA: Diagnosis not present

## 2024-06-03 DIAGNOSIS — Z7682 Awaiting organ transplant status: Secondary | ICD-10-CM | POA: Diagnosis not present

## 2024-06-03 DIAGNOSIS — T8699 Other complications of unspecified transplanted organ and tissue: Secondary | ICD-10-CM | POA: Diagnosis not present

## 2024-06-03 DIAGNOSIS — Z524 Kidney donor: Secondary | ICD-10-CM | POA: Diagnosis not present

## 2024-06-03 DIAGNOSIS — R7989 Other specified abnormal findings of blood chemistry: Secondary | ICD-10-CM | POA: Diagnosis not present

## 2024-06-03 DIAGNOSIS — D631 Anemia in chronic kidney disease: Secondary | ICD-10-CM | POA: Diagnosis not present

## 2024-06-03 DIAGNOSIS — T380X5A Adverse effect of glucocorticoids and synthetic analogues, initial encounter: Secondary | ICD-10-CM | POA: Diagnosis not present

## 2024-06-04 DIAGNOSIS — N189 Chronic kidney disease, unspecified: Secondary | ICD-10-CM | POA: Diagnosis not present

## 2024-06-04 DIAGNOSIS — Z940 Kidney transplant status: Secondary | ICD-10-CM | POA: Diagnosis not present

## 2024-06-04 DIAGNOSIS — D631 Anemia in chronic kidney disease: Secondary | ICD-10-CM | POA: Diagnosis not present

## 2024-06-04 DIAGNOSIS — N186 End stage renal disease: Secondary | ICD-10-CM | POA: Diagnosis not present

## 2024-06-05 DIAGNOSIS — N186 End stage renal disease: Secondary | ICD-10-CM | POA: Diagnosis not present

## 2024-06-05 DIAGNOSIS — N189 Chronic kidney disease, unspecified: Secondary | ICD-10-CM | POA: Diagnosis not present

## 2024-06-05 DIAGNOSIS — D631 Anemia in chronic kidney disease: Secondary | ICD-10-CM | POA: Diagnosis not present

## 2024-06-05 DIAGNOSIS — Z940 Kidney transplant status: Secondary | ICD-10-CM | POA: Diagnosis not present

## 2024-06-05 DIAGNOSIS — R7989 Other specified abnormal findings of blood chemistry: Secondary | ICD-10-CM | POA: Diagnosis not present

## 2024-06-06 DIAGNOSIS — R918 Other nonspecific abnormal finding of lung field: Secondary | ICD-10-CM | POA: Diagnosis not present

## 2024-06-20 DIAGNOSIS — D631 Anemia in chronic kidney disease: Secondary | ICD-10-CM | POA: Diagnosis not present

## 2024-06-20 DIAGNOSIS — Z13220 Encounter for screening for lipoid disorders: Secondary | ICD-10-CM | POA: Diagnosis not present

## 2024-06-20 DIAGNOSIS — Z48298 Encounter for aftercare following other organ transplant: Secondary | ICD-10-CM | POA: Diagnosis not present

## 2024-06-20 DIAGNOSIS — N186 End stage renal disease: Secondary | ICD-10-CM | POA: Diagnosis not present

## 2024-06-23 DIAGNOSIS — Z48298 Encounter for aftercare following other organ transplant: Secondary | ICD-10-CM | POA: Diagnosis not present

## 2024-06-25 DIAGNOSIS — D849 Immunodeficiency, unspecified: Secondary | ICD-10-CM | POA: Diagnosis not present

## 2024-06-25 DIAGNOSIS — N189 Chronic kidney disease, unspecified: Secondary | ICD-10-CM | POA: Diagnosis not present

## 2024-06-25 DIAGNOSIS — N179 Acute kidney failure, unspecified: Secondary | ICD-10-CM | POA: Diagnosis not present

## 2024-06-25 DIAGNOSIS — Z940 Kidney transplant status: Secondary | ICD-10-CM | POA: Diagnosis not present

## 2024-06-27 DIAGNOSIS — N186 End stage renal disease: Secondary | ICD-10-CM | POA: Diagnosis not present

## 2024-06-27 DIAGNOSIS — T8619 Other complication of kidney transplant: Secondary | ICD-10-CM | POA: Diagnosis not present

## 2024-06-27 DIAGNOSIS — Z48298 Encounter for aftercare following other organ transplant: Secondary | ICD-10-CM | POA: Diagnosis not present

## 2024-06-27 DIAGNOSIS — E1122 Type 2 diabetes mellitus with diabetic chronic kidney disease: Secondary | ICD-10-CM | POA: Diagnosis not present

## 2024-06-27 DIAGNOSIS — D849 Immunodeficiency, unspecified: Secondary | ICD-10-CM | POA: Diagnosis not present

## 2024-06-27 DIAGNOSIS — I10 Essential (primary) hypertension: Secondary | ICD-10-CM | POA: Diagnosis not present

## 2024-06-27 DIAGNOSIS — D631 Anemia in chronic kidney disease: Secondary | ICD-10-CM | POA: Diagnosis not present

## 2024-06-27 DIAGNOSIS — Z940 Kidney transplant status: Secondary | ICD-10-CM | POA: Diagnosis not present

## 2024-06-27 DIAGNOSIS — B259 Cytomegaloviral disease, unspecified: Secondary | ICD-10-CM | POA: Diagnosis not present

## 2024-06-28 DIAGNOSIS — N189 Chronic kidney disease, unspecified: Secondary | ICD-10-CM | POA: Diagnosis not present

## 2024-06-28 DIAGNOSIS — N179 Acute kidney failure, unspecified: Secondary | ICD-10-CM | POA: Diagnosis not present

## 2024-07-01 DIAGNOSIS — B259 Cytomegaloviral disease, unspecified: Secondary | ICD-10-CM | POA: Diagnosis not present

## 2024-07-01 DIAGNOSIS — R1903 Right lower quadrant abdominal swelling, mass and lump: Secondary | ICD-10-CM | POA: Diagnosis not present

## 2024-07-01 DIAGNOSIS — E1122 Type 2 diabetes mellitus with diabetic chronic kidney disease: Secondary | ICD-10-CM | POA: Diagnosis not present

## 2024-07-01 DIAGNOSIS — I471 Supraventricular tachycardia, unspecified: Secondary | ICD-10-CM | POA: Diagnosis not present

## 2024-07-01 DIAGNOSIS — N179 Acute kidney failure, unspecified: Secondary | ICD-10-CM | POA: Diagnosis not present

## 2024-07-01 DIAGNOSIS — Z960 Presence of urogenital implants: Secondary | ICD-10-CM | POA: Diagnosis not present

## 2024-07-01 DIAGNOSIS — B258 Other cytomegaloviral diseases: Secondary | ICD-10-CM | POA: Diagnosis not present

## 2024-07-01 DIAGNOSIS — D84821 Immunodeficiency due to drugs: Secondary | ICD-10-CM | POA: Diagnosis not present

## 2024-07-01 DIAGNOSIS — T380X5A Adverse effect of glucocorticoids and synthetic analogues, initial encounter: Secondary | ICD-10-CM | POA: Diagnosis not present

## 2024-07-01 DIAGNOSIS — R1900 Intra-abdominal and pelvic swelling, mass and lump, unspecified site: Secondary | ICD-10-CM | POA: Diagnosis not present

## 2024-07-01 DIAGNOSIS — D631 Anemia in chronic kidney disease: Secondary | ICD-10-CM | POA: Diagnosis not present

## 2024-07-01 DIAGNOSIS — N2581 Secondary hyperparathyroidism of renal origin: Secondary | ICD-10-CM | POA: Diagnosis not present

## 2024-07-01 DIAGNOSIS — E1165 Type 2 diabetes mellitus with hyperglycemia: Secondary | ICD-10-CM | POA: Diagnosis not present

## 2024-07-01 DIAGNOSIS — Z794 Long term (current) use of insulin: Secondary | ICD-10-CM | POA: Diagnosis not present

## 2024-07-01 DIAGNOSIS — S37099A Other injury of unspecified kidney, initial encounter: Secondary | ICD-10-CM | POA: Diagnosis not present

## 2024-07-01 DIAGNOSIS — E875 Hyperkalemia: Secondary | ICD-10-CM | POA: Diagnosis not present

## 2024-07-01 DIAGNOSIS — M311 Thrombotic microangiopathy, unspecified: Secondary | ICD-10-CM | POA: Diagnosis not present

## 2024-07-01 DIAGNOSIS — T8619 Other complication of kidney transplant: Secondary | ICD-10-CM | POA: Diagnosis not present

## 2024-07-01 DIAGNOSIS — Z0389 Encounter for observation for other suspected diseases and conditions ruled out: Secondary | ICD-10-CM | POA: Diagnosis not present

## 2024-07-01 DIAGNOSIS — L02211 Cutaneous abscess of abdominal wall: Secondary | ICD-10-CM | POA: Diagnosis not present

## 2024-07-01 DIAGNOSIS — Z792 Long term (current) use of antibiotics: Secondary | ICD-10-CM | POA: Diagnosis not present

## 2024-07-01 DIAGNOSIS — Z4682 Encounter for fitting and adjustment of non-vascular catheter: Secondary | ICD-10-CM | POA: Diagnosis not present

## 2024-07-01 DIAGNOSIS — I152 Hypertension secondary to endocrine disorders: Secondary | ICD-10-CM | POA: Diagnosis not present

## 2024-07-01 DIAGNOSIS — Z992 Dependence on renal dialysis: Secondary | ICD-10-CM | POA: Diagnosis not present

## 2024-07-01 DIAGNOSIS — I10 Essential (primary) hypertension: Secondary | ICD-10-CM | POA: Diagnosis not present

## 2024-07-01 DIAGNOSIS — Z940 Kidney transplant status: Secondary | ICD-10-CM | POA: Diagnosis not present

## 2024-07-02 DIAGNOSIS — N179 Acute kidney failure, unspecified: Secondary | ICD-10-CM | POA: Diagnosis not present

## 2024-07-02 DIAGNOSIS — D84821 Immunodeficiency due to drugs: Secondary | ICD-10-CM | POA: Diagnosis not present

## 2024-07-02 DIAGNOSIS — E875 Hyperkalemia: Secondary | ICD-10-CM | POA: Diagnosis not present

## 2024-07-02 DIAGNOSIS — T8619 Other complication of kidney transplant: Secondary | ICD-10-CM | POA: Diagnosis not present

## 2024-07-02 DIAGNOSIS — D631 Anemia in chronic kidney disease: Secondary | ICD-10-CM | POA: Diagnosis not present

## 2024-07-02 DIAGNOSIS — Z940 Kidney transplant status: Secondary | ICD-10-CM | POA: Diagnosis not present

## 2024-07-02 DIAGNOSIS — M311 Thrombotic microangiopathy, unspecified: Secondary | ICD-10-CM | POA: Diagnosis not present

## 2024-07-03 DIAGNOSIS — E875 Hyperkalemia: Secondary | ICD-10-CM | POA: Diagnosis not present

## 2024-07-03 DIAGNOSIS — M311 Thrombotic microangiopathy, unspecified: Secondary | ICD-10-CM | POA: Diagnosis not present

## 2024-07-03 DIAGNOSIS — N179 Acute kidney failure, unspecified: Secondary | ICD-10-CM | POA: Diagnosis not present

## 2024-07-03 DIAGNOSIS — D84821 Immunodeficiency due to drugs: Secondary | ICD-10-CM | POA: Diagnosis not present

## 2024-07-03 DIAGNOSIS — T8619 Other complication of kidney transplant: Secondary | ICD-10-CM | POA: Diagnosis not present

## 2024-07-03 DIAGNOSIS — D631 Anemia in chronic kidney disease: Secondary | ICD-10-CM | POA: Diagnosis not present

## 2024-07-03 DIAGNOSIS — Z940 Kidney transplant status: Secondary | ICD-10-CM | POA: Diagnosis not present

## 2024-07-10 DIAGNOSIS — D849 Immunodeficiency, unspecified: Secondary | ICD-10-CM | POA: Diagnosis not present

## 2024-07-10 DIAGNOSIS — Z940 Kidney transplant status: Secondary | ICD-10-CM | POA: Diagnosis not present

## 2024-07-10 DIAGNOSIS — N186 End stage renal disease: Secondary | ICD-10-CM | POA: Diagnosis not present

## 2024-07-10 DIAGNOSIS — B259 Cytomegaloviral disease, unspecified: Secondary | ICD-10-CM | POA: Diagnosis not present

## 2024-07-10 DIAGNOSIS — I10 Essential (primary) hypertension: Secondary | ICD-10-CM | POA: Diagnosis not present

## 2024-07-10 DIAGNOSIS — E1122 Type 2 diabetes mellitus with diabetic chronic kidney disease: Secondary | ICD-10-CM | POA: Diagnosis not present

## 2024-07-10 DIAGNOSIS — Z48298 Encounter for aftercare following other organ transplant: Secondary | ICD-10-CM | POA: Diagnosis not present

## 2024-07-10 DIAGNOSIS — T8619 Other complication of kidney transplant: Secondary | ICD-10-CM | POA: Diagnosis not present

## 2024-07-10 DIAGNOSIS — E875 Hyperkalemia: Secondary | ICD-10-CM | POA: Diagnosis not present

## 2024-07-10 DIAGNOSIS — D631 Anemia in chronic kidney disease: Secondary | ICD-10-CM | POA: Diagnosis not present

## 2024-07-11 DIAGNOSIS — R944 Abnormal results of kidney function studies: Secondary | ICD-10-CM | POA: Diagnosis not present

## 2024-07-11 DIAGNOSIS — Z48298 Encounter for aftercare following other organ transplant: Secondary | ICD-10-CM | POA: Diagnosis not present

## 2024-07-11 DIAGNOSIS — T8619 Other complication of kidney transplant: Secondary | ICD-10-CM | POA: Diagnosis not present

## 2024-07-11 DIAGNOSIS — Z992 Dependence on renal dialysis: Secondary | ICD-10-CM | POA: Diagnosis not present

## 2024-07-12 DIAGNOSIS — B259 Cytomegaloviral disease, unspecified: Secondary | ICD-10-CM | POA: Diagnosis not present

## 2024-07-12 DIAGNOSIS — D631 Anemia in chronic kidney disease: Secondary | ICD-10-CM | POA: Diagnosis not present

## 2024-07-12 DIAGNOSIS — Z940 Kidney transplant status: Secondary | ICD-10-CM | POA: Diagnosis not present

## 2024-07-12 DIAGNOSIS — T8619 Other complication of kidney transplant: Secondary | ICD-10-CM | POA: Diagnosis not present

## 2024-07-12 DIAGNOSIS — D849 Immunodeficiency, unspecified: Secondary | ICD-10-CM | POA: Diagnosis not present

## 2024-07-12 DIAGNOSIS — E1122 Type 2 diabetes mellitus with diabetic chronic kidney disease: Secondary | ICD-10-CM | POA: Diagnosis not present

## 2024-07-12 DIAGNOSIS — Z48298 Encounter for aftercare following other organ transplant: Secondary | ICD-10-CM | POA: Diagnosis not present

## 2024-07-12 DIAGNOSIS — N186 End stage renal disease: Secondary | ICD-10-CM | POA: Diagnosis not present

## 2024-07-12 DIAGNOSIS — I10 Essential (primary) hypertension: Secondary | ICD-10-CM | POA: Diagnosis not present

## 2024-07-14 DIAGNOSIS — Z940 Kidney transplant status: Secondary | ICD-10-CM | POA: Diagnosis not present

## 2024-07-14 DIAGNOSIS — Z48298 Encounter for aftercare following other organ transplant: Secondary | ICD-10-CM | POA: Diagnosis not present

## 2024-07-14 DIAGNOSIS — E1122 Type 2 diabetes mellitus with diabetic chronic kidney disease: Secondary | ICD-10-CM | POA: Diagnosis not present

## 2024-07-15 DIAGNOSIS — N186 End stage renal disease: Secondary | ICD-10-CM | POA: Diagnosis not present

## 2024-07-15 DIAGNOSIS — Z794 Long term (current) use of insulin: Secondary | ICD-10-CM | POA: Diagnosis not present

## 2024-07-15 DIAGNOSIS — Z992 Dependence on renal dialysis: Secondary | ICD-10-CM | POA: Diagnosis not present

## 2024-07-15 DIAGNOSIS — N189 Chronic kidney disease, unspecified: Secondary | ICD-10-CM | POA: Diagnosis not present

## 2024-07-15 DIAGNOSIS — N179 Acute kidney failure, unspecified: Secondary | ICD-10-CM | POA: Diagnosis not present

## 2024-07-15 DIAGNOSIS — E119 Type 2 diabetes mellitus without complications: Secondary | ICD-10-CM | POA: Diagnosis not present

## 2024-07-16 DIAGNOSIS — Z794 Long term (current) use of insulin: Secondary | ICD-10-CM | POA: Diagnosis not present

## 2024-07-16 DIAGNOSIS — D84821 Immunodeficiency due to drugs: Secondary | ICD-10-CM | POA: Diagnosis not present

## 2024-07-16 DIAGNOSIS — D6489 Other specified anemias: Secondary | ICD-10-CM | POA: Diagnosis not present

## 2024-07-16 DIAGNOSIS — Z7960 Long term (current) use of unspecified immunomodulators and immunosuppressants: Secondary | ICD-10-CM | POA: Diagnosis not present

## 2024-07-16 DIAGNOSIS — Z940 Kidney transplant status: Secondary | ICD-10-CM | POA: Diagnosis not present

## 2024-07-16 DIAGNOSIS — B259 Cytomegaloviral disease, unspecified: Secondary | ICD-10-CM | POA: Diagnosis not present

## 2024-07-16 DIAGNOSIS — T8613 Kidney transplant infection: Secondary | ICD-10-CM | POA: Diagnosis not present

## 2024-07-16 DIAGNOSIS — T888XXD Other specified complications of surgical and medical care, not elsewhere classified, subsequent encounter: Secondary | ICD-10-CM | POA: Diagnosis not present

## 2024-07-16 DIAGNOSIS — R944 Abnormal results of kidney function studies: Secondary | ICD-10-CM | POA: Diagnosis not present

## 2024-07-16 DIAGNOSIS — D849 Immunodeficiency, unspecified: Secondary | ICD-10-CM | POA: Diagnosis not present

## 2024-07-16 DIAGNOSIS — N179 Acute kidney failure, unspecified: Secondary | ICD-10-CM | POA: Diagnosis not present

## 2024-07-16 DIAGNOSIS — Z6832 Body mass index (BMI) 32.0-32.9, adult: Secondary | ICD-10-CM | POA: Diagnosis not present

## 2024-07-16 DIAGNOSIS — T8619 Other complication of kidney transplant: Secondary | ICD-10-CM | POA: Diagnosis not present

## 2024-07-23 DIAGNOSIS — R188 Other ascites: Secondary | ICD-10-CM | POA: Diagnosis not present

## 2024-07-23 DIAGNOSIS — Z940 Kidney transplant status: Secondary | ICD-10-CM | POA: Diagnosis not present

## 2024-07-23 DIAGNOSIS — N179 Acute kidney failure, unspecified: Secondary | ICD-10-CM | POA: Diagnosis not present

## 2024-07-23 DIAGNOSIS — Z949 Transplanted organ and tissue status, unspecified: Secondary | ICD-10-CM | POA: Diagnosis not present

## 2024-07-23 DIAGNOSIS — D849 Immunodeficiency, unspecified: Secondary | ICD-10-CM | POA: Diagnosis not present

## 2024-07-23 DIAGNOSIS — B259 Cytomegaloviral disease, unspecified: Secondary | ICD-10-CM | POA: Diagnosis not present

## 2024-07-23 DIAGNOSIS — E1122 Type 2 diabetes mellitus with diabetic chronic kidney disease: Secondary | ICD-10-CM | POA: Diagnosis not present

## 2024-07-23 DIAGNOSIS — Z48298 Encounter for aftercare following other organ transplant: Secondary | ICD-10-CM | POA: Diagnosis not present

## 2024-07-24 DIAGNOSIS — Z940 Kidney transplant status: Secondary | ICD-10-CM | POA: Diagnosis not present

## 2024-07-24 DIAGNOSIS — R319 Hematuria, unspecified: Secondary | ICD-10-CM | POA: Diagnosis not present

## 2024-07-25 DIAGNOSIS — Z48298 Encounter for aftercare following other organ transplant: Secondary | ICD-10-CM | POA: Diagnosis not present

## 2024-07-28 DIAGNOSIS — D849 Immunodeficiency, unspecified: Secondary | ICD-10-CM | POA: Diagnosis not present

## 2024-07-28 DIAGNOSIS — E785 Hyperlipidemia, unspecified: Secondary | ICD-10-CM | POA: Diagnosis not present

## 2024-07-28 DIAGNOSIS — E1129 Type 2 diabetes mellitus with other diabetic kidney complication: Secondary | ICD-10-CM | POA: Diagnosis not present

## 2024-07-28 DIAGNOSIS — R188 Other ascites: Secondary | ICD-10-CM | POA: Diagnosis not present

## 2024-07-28 DIAGNOSIS — Z940 Kidney transplant status: Secondary | ICD-10-CM | POA: Diagnosis not present

## 2024-07-28 DIAGNOSIS — I152 Hypertension secondary to endocrine disorders: Secondary | ICD-10-CM | POA: Diagnosis not present

## 2024-07-28 DIAGNOSIS — R809 Proteinuria, unspecified: Secondary | ICD-10-CM | POA: Diagnosis not present

## 2024-07-28 DIAGNOSIS — E1159 Type 2 diabetes mellitus with other circulatory complications: Secondary | ICD-10-CM | POA: Diagnosis not present

## 2024-07-28 DIAGNOSIS — N2581 Secondary hyperparathyroidism of renal origin: Secondary | ICD-10-CM | POA: Diagnosis not present

## 2024-07-28 DIAGNOSIS — E1169 Type 2 diabetes mellitus with other specified complication: Secondary | ICD-10-CM | POA: Diagnosis not present

## 2024-07-28 DIAGNOSIS — N179 Acute kidney failure, unspecified: Secondary | ICD-10-CM | POA: Diagnosis not present

## 2024-07-28 DIAGNOSIS — Z794 Long term (current) use of insulin: Secondary | ICD-10-CM | POA: Diagnosis not present

## 2024-07-28 DIAGNOSIS — B259 Cytomegaloviral disease, unspecified: Secondary | ICD-10-CM | POA: Diagnosis not present

## 2024-07-31 DIAGNOSIS — Z940 Kidney transplant status: Secondary | ICD-10-CM | POA: Diagnosis not present

## 2024-07-31 DIAGNOSIS — E1122 Type 2 diabetes mellitus with diabetic chronic kidney disease: Secondary | ICD-10-CM | POA: Diagnosis not present

## 2024-07-31 DIAGNOSIS — T8619 Other complication of kidney transplant: Secondary | ICD-10-CM | POA: Diagnosis not present

## 2024-07-31 DIAGNOSIS — D849 Immunodeficiency, unspecified: Secondary | ICD-10-CM | POA: Diagnosis not present

## 2024-07-31 DIAGNOSIS — I10 Essential (primary) hypertension: Secondary | ICD-10-CM | POA: Diagnosis not present

## 2024-07-31 DIAGNOSIS — R188 Other ascites: Secondary | ICD-10-CM | POA: Diagnosis not present

## 2024-07-31 DIAGNOSIS — N179 Acute kidney failure, unspecified: Secondary | ICD-10-CM | POA: Diagnosis not present

## 2024-07-31 DIAGNOSIS — B259 Cytomegaloviral disease, unspecified: Secondary | ICD-10-CM | POA: Diagnosis not present

## 2024-08-01 DIAGNOSIS — E1129 Type 2 diabetes mellitus with other diabetic kidney complication: Secondary | ICD-10-CM | POA: Diagnosis not present

## 2024-08-04 DIAGNOSIS — Z48298 Encounter for aftercare following other organ transplant: Secondary | ICD-10-CM | POA: Diagnosis not present

## 2024-08-07 DIAGNOSIS — B259 Cytomegaloviral disease, unspecified: Secondary | ICD-10-CM | POA: Diagnosis not present

## 2024-08-07 DIAGNOSIS — Z940 Kidney transplant status: Secondary | ICD-10-CM | POA: Diagnosis not present

## 2024-08-07 DIAGNOSIS — Z452 Encounter for adjustment and management of vascular access device: Secondary | ICD-10-CM | POA: Diagnosis not present

## 2024-08-07 DIAGNOSIS — Z48298 Encounter for aftercare following other organ transplant: Secondary | ICD-10-CM | POA: Diagnosis not present

## 2024-08-07 DIAGNOSIS — D849 Immunodeficiency, unspecified: Secondary | ICD-10-CM | POA: Diagnosis not present

## 2024-08-07 DIAGNOSIS — R7989 Other specified abnormal findings of blood chemistry: Secondary | ICD-10-CM | POA: Diagnosis not present

## 2024-08-07 DIAGNOSIS — I10 Essential (primary) hypertension: Secondary | ICD-10-CM | POA: Diagnosis not present

## 2024-08-11 DIAGNOSIS — Z940 Kidney transplant status: Secondary | ICD-10-CM | POA: Diagnosis not present

## 2024-08-14 DIAGNOSIS — Z794 Long term (current) use of insulin: Secondary | ICD-10-CM | POA: Diagnosis not present

## 2024-08-14 DIAGNOSIS — T8619 Other complication of kidney transplant: Secondary | ICD-10-CM | POA: Diagnosis not present

## 2024-08-14 DIAGNOSIS — E669 Obesity, unspecified: Secondary | ICD-10-CM | POA: Diagnosis not present

## 2024-08-14 DIAGNOSIS — N2581 Secondary hyperparathyroidism of renal origin: Secondary | ICD-10-CM | POA: Diagnosis not present

## 2024-08-14 DIAGNOSIS — T8610 Unspecified complication of kidney transplant: Secondary | ICD-10-CM | POA: Diagnosis not present

## 2024-08-14 DIAGNOSIS — E875 Hyperkalemia: Secondary | ICD-10-CM | POA: Diagnosis not present

## 2024-08-14 DIAGNOSIS — D849 Immunodeficiency, unspecified: Secondary | ICD-10-CM | POA: Diagnosis not present

## 2024-08-14 DIAGNOSIS — Z48298 Encounter for aftercare following other organ transplant: Secondary | ICD-10-CM | POA: Diagnosis not present

## 2024-08-14 DIAGNOSIS — Z940 Kidney transplant status: Secondary | ICD-10-CM | POA: Diagnosis not present

## 2024-08-14 DIAGNOSIS — E1121 Type 2 diabetes mellitus with diabetic nephropathy: Secondary | ICD-10-CM | POA: Diagnosis not present

## 2024-08-14 DIAGNOSIS — B259 Cytomegaloviral disease, unspecified: Secondary | ICD-10-CM | POA: Diagnosis not present

## 2024-08-18 DIAGNOSIS — Z48298 Encounter for aftercare following other organ transplant: Secondary | ICD-10-CM | POA: Diagnosis not present

## 2024-08-22 DIAGNOSIS — Z48298 Encounter for aftercare following other organ transplant: Secondary | ICD-10-CM | POA: Diagnosis not present

## 2024-08-22 DIAGNOSIS — Z940 Kidney transplant status: Secondary | ICD-10-CM | POA: Diagnosis not present

## 2024-08-25 DIAGNOSIS — Z940 Kidney transplant status: Secondary | ICD-10-CM | POA: Diagnosis not present

## 2024-08-25 DIAGNOSIS — Z48298 Encounter for aftercare following other organ transplant: Secondary | ICD-10-CM | POA: Diagnosis not present

## 2024-08-25 DIAGNOSIS — E1122 Type 2 diabetes mellitus with diabetic chronic kidney disease: Secondary | ICD-10-CM | POA: Diagnosis not present

## 2024-08-26 DIAGNOSIS — Z79624 Long term (current) use of inhibitors of nucleotide synthesis: Secondary | ICD-10-CM | POA: Diagnosis not present

## 2024-08-26 DIAGNOSIS — I10 Essential (primary) hypertension: Secondary | ICD-10-CM | POA: Diagnosis not present

## 2024-08-26 DIAGNOSIS — Z940 Kidney transplant status: Secondary | ICD-10-CM | POA: Diagnosis not present

## 2024-08-26 DIAGNOSIS — T8619 Other complication of kidney transplant: Secondary | ICD-10-CM | POA: Diagnosis not present

## 2024-08-26 DIAGNOSIS — T8610 Unspecified complication of kidney transplant: Secondary | ICD-10-CM | POA: Diagnosis not present

## 2024-08-26 DIAGNOSIS — I471 Supraventricular tachycardia, unspecified: Secondary | ICD-10-CM | POA: Diagnosis not present

## 2024-08-26 DIAGNOSIS — N170 Acute kidney failure with tubular necrosis: Secondary | ICD-10-CM | POA: Diagnosis not present

## 2024-08-26 DIAGNOSIS — B957 Other staphylococcus as the cause of diseases classified elsewhere: Secondary | ICD-10-CM | POA: Diagnosis not present

## 2024-08-26 DIAGNOSIS — D84821 Immunodeficiency due to drugs: Secondary | ICD-10-CM | POA: Diagnosis not present

## 2024-08-26 DIAGNOSIS — N179 Acute kidney failure, unspecified: Secondary | ICD-10-CM | POA: Diagnosis not present

## 2024-08-26 NOTE — H&P (Signed)
 Nephrology History and Physical    Assessment and Plan   1. Acute kidney injury/slow graft function.  2. S.p Kidney transplant. 3. Immune suppression  4. Recent CMV viremia  5. Hypertension  Recs :   A. Admit B. Ultrasound  C. Biopsy in am   Allosure :   10.28.25 : 0.09 11.20.2025 : 0.14  DSA : 11.20.25 : NONE. [name]  [name] MD,FACP, FASN Attending Nephrologist, [facility]  Admission Date:  (Not on file)  PCP:  Lauraine Lamarr Leak, FNP  Patient Contact: Extended Emergency Contact Information Primary Emergency Contact: Guard,Dwight Mobile Phone: [phone] Relation: Spouse Secondary Emergency Contact: Southern,Aaliyah Mobile Phone: [phone] Relation: Daughter   History of present illness    Erin Crawford is a 63 y.o. 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    Recipient history CPRA 90% Port score 4.4% EPTS 85% HLA mismatch 2-2-1 CMV +D/+R.  EBV +D/+R.  Toxoplasma -D/-R.    Induction:  Thymoglobulin    Her post-transplant course was noted for DGF. She is pre-sensitized with PRA 90%.  Post-transplant Luminex revealed low-level DSA (MFI 1900).  She underwent surveillance biopsy on 06/09/2024, which revealed thrombotic microangiopathy; moderate acute tubular injury; moderate arteriosclerosis; moderate arteriolar hyalinosis; mild IFTA; mild capillaritis (PTC1); no cellular rejection. The small intralobular arteries demonstrated segmental fibrinoid necrosis with accumulation of neutrophils and lymphocytes focally. Arteries demonstrated evidence of endothelial injury and activation. C4d staining was negative. MMDx revealed moderate AKI, no ABMR, no TCMR, mild inflammation and minimal atrophy-fibrosis.     She underwent PLEX x 3 (9/17, 9/19 and 9/20). She received full dose eculizumab  on 9/16 with supplemental doses on 9/17 and 9/19 (post-PLEX). She was switched to Ultomiris: first infusion on 9/24, next on 10/8, then every 8 weeks. She was on PD prior to transplant.  She received PLEX and hemodialysis via Vascath. Her last hemodialysis was on 9/23.  Hospital course noted symptomatic hypocalcemia, SVT, hypotension, bradycardia, and DGF. Required blood transfusion post-biopsy.  She was discharged on 06/18/2024.    He was rehospitalized from October 7-14, 2025.  She underwent kidney biopsy on 8 October.  It showed mild acute tubular injury with focal dystrophic calcification, moderate arteriosclerosis and mild IFTA.  No evidence of rejection.  No TMA.  C4d and BK staining are negative. She got her last dose of Ravulizumab on 8 October. She also had possible abdominal wall infection. Patient underwent ultrasound-guided drain placement on 10/9. Culture grew Staph epidermidis in both aerobic and anaerobic set. She was discharged on linezolid 600 mg twice a day to stop on October 19   Luminex on 06/20/2024 with DSA against DPB1 at 1200 MFI   Maintenance immunosuppression   Tacrolimus  3 mg in AM and 2 mg in PM CellCept 500 mg : 2 in am and 1 in pm Prednisone 10 mg po daily. Ravulizumab last dose October 8    On Pen VK : till Jan 8 th 2026. Valacyte 450 mg po daily.  Since her creatinine is stuck @ 3.1 : we decided to consider biopsy Last ultrasound : 10.24.25. Will get one today or tomorrow.   Length of stay : 0 days  Patient Active Problem List   Diagnosis Date Noted  . Thrombotic microangiopathy of kidney transplant    (  CMD) 08/14/2024  . Hyperkalemia 08/14/2024  . Abdominal fluid collection 07/31/2024  . Hypocalcemia 07/31/2024  . Secondary hyperparathyroidism of renal origin (CMD) 07/31/2024  . Prophylactic antibiotic 07/31/2024  . Kidney replaced by transplant (CMD) 07/17/2024  .  Immunosuppression (CMD) 07/16/2024  . Cytomegalovirus (CMV) viremia    (CMD) 07/16/2024  . Fluid collection at surgical site 07/16/2024  . Delayed graft function of kidney (CMD) 07/16/2024  . CMV (cytomegalovirus) infection    (CMD) 07/16/2024  . Other primary thrombocytopenia 07/10/2024  . AKI (acute kidney injury) 07/01/2024  . History of colonic polyps 06/18/2024  . Polyp of colon 06/18/2024  . Kidney transplant status (CMD) 06/04/2024  . ESRD (end stage renal disease) on dialysis    (CMD) 03/29/2024  . Diabetes mellitus, type II (CMD) 08/07/2022  . Obesity (BMI 30-39.9) 07/22/2020  . Fitting and adjustment of peritoneal dialysis catheter 07/22/2020  . Benign essential hypertension 11/06/2019  . Stage 3 chronic kidney disease (CMD) 11/06/2019  . Type 2 diabetes mellitus with diabetic chronic kidney disease    (CMD) 11/06/2019  . Proteinuria 11/06/2019  . CKD (chronic kidney disease) stage 5, GFR less than 15 ml/min    (CMD) 10/14/2019  . Diabetic retinopathy    (CMD) 10/14/2019  . Microalbuminuria due to type 2 diabetes mellitus    (CMD) 10/14/2019  . Anemia of chronic kidney failure, unspecified stage 12/23/2018  . Bilateral carotid artery stenosis 07/20/2016  . Hypertension associated with diabetes    (CMD) 03/16/2015  . Hyperlipidemia due to type 2 diabetes mellitus    (CMD) 09/11/2014  . Long-term insulin use    (CMD) 09/11/2014  . OSA (obstructive sleep apnea) 09/26/1999    Medical History[1]   Surgical History[2]   Prescriptions Prior to Admission[3]  Allergies[4]   Social History   Tobacco Use  . Smoking status: Never  . Smokeless tobacco: Never  Substance Use Topics  . Alcohol use: Never     Family History[5]    Review of Systems    General : patient denies any fatigue, weakness, weight loss of weight gain Eyes : denies any vision change, blurred or double vision. Cardiovascular : denies any chest pain, shortness of breath, Dyspnea on  exertion Respiratory : denies any cough, sputum production, hemoptysis Neurological : denies any headaches, nausea, numbness, weakness, tingling. Gastrointestinal : denies any nausea, vomiting, diarrhea, constipation Musculoskeletal : denies any joint pains, arthralgias Extremities : denies any peripheral edema Endocrinology : denies any excessive thirst. Skin : denies any rashes.   Examination    No data found.   No intake/output data recorded. No intake/output data recorded.  Deferred.   Labs and Imaging   No visits with results within 1 Day(s) from this visit.  Latest known visit with results is:  Orders Only on 08/25/2024  Component Date Value Ref Range Status  . WBC 08/25/2024 1.6 (L)  3.4 - 10.8 x10E3/uL Final  . RBC 08/25/2024 3.21 (L)  3.77 - 5.28 x10E6/uL Final  . Hemoglobin 08/25/2024 10.4 (L)  11.1 - 15.9 g/dL Final  . Hematocrit 87/98/7974 32.6 (L)  34.0 - 46.6 % Final  . MCV 08/25/2024 102 (H)  79 - 97 fL Final  . MCH 08/25/2024 32.4  26.6 - 33.0 pg Final  . MCHC 08/25/2024 31.9  31.5 - 35.7 g/dL Final  . RDW 87/98/7974 16.5 (H)  11.7 - 15.4 % Final  . Platelets 08/25/2024 177  150 - 450 x10E3/uL Final  . Neutrophils 08/25/2024 76  Not Estab. %  Final  . Lymphocytes 08/25/2024 12  Not Estab. % Final  . Monocytes 08/25/2024 4  Not Estab. % Final  . Eosinophils 08/25/2024 4  Not Estab. % Final  . Basophils 08/25/2024 4  Not Estab. % Final  . Neutrophils Absolute 08/25/2024 1.2 (L)  1.4 - 7.0 x10E3/uL Final  . Lymphocytes Absolute 08/25/2024 0.2 (L)  0.7 - 3.1 x10E3/uL Final  . Monocytes Absolute 08/25/2024 0.1  0.1 - 0.9 x10E3/uL Final  . Eosinophils Absolute 08/25/2024 0.1  0.0 - 0.4 x10E3/uL Final  . Basophils Absolute 08/25/2024 0.1  0.0 - 0.2 x10E3/uL Final  . Hematology Comments: 08/25/2024 Note:   Final  . Tacrolimus  (FK506), Blood 08/25/2024 5.6  5.0 - 20.0 ng/mL Final  . Glucose 08/25/2024 165 (H)  70 - 99 mg/dL Final  . Urea  Nitrogen, Blood (BUN)  08/25/2024 52 (H)  8 - 27 mg/dL Final  . Creatinine 87/98/7974 3.11 (H)  0.57 - 1.00 mg/dL Final  . eGFR 87/98/7974 16 (L)  >59 mL/min/1.73 Final  . Blood Urea  Nitrogen (BUN)/Creatini* 08/25/2024 17  12 - 28 Final  . Sodium 08/25/2024 135  134 - 144 mmol/L Final  . Potassium 08/25/2024 4.8  3.5 - 5.2 mmol/L Final  . Chloride 08/25/2024 107 (H)  96 - 106 mmol/L Final  . CO2 08/25/2024 14 (L)  20 - 29 mmol/L Final  . Calcium  08/25/2024 10.0  8.7 - 10.3 mg/dL Final  . Phosphorus 87/98/7974 3.6  3.0 - 4.3 mg/dL Final  . Albumin 87/98/7974 3.9  3.9 - 4.9 g/dL Final     Lab Results  Component Value Date   CREATININE 3.11 (H) 08/25/2024   BUN 52 (H) 08/25/2024   NA 135 08/25/2024   K 4.8 08/25/2024   CL 107 (H) 08/25/2024   CO2 14 (L) 08/25/2024    Lab Results  Component Value Date   WBC 1.6 (L) 08/25/2024   HGB 10.4 (L) 08/25/2024   HCT 32.6 (L) 08/25/2024   MCV 102 (H) 08/25/2024   PLT 154 08/22/2024               [1] No past medical history on file. [2] Past Surgical History: Procedure Laterality Date  . TRANSPLANTATION RENAL N/A 06/03/2024   TRANSPLANT KIDNEY CADAVERIC  [id number]  [id number] performed by Sharolyn JONELLE Shope, MD at [facility] OR  [3] No medications prior to admission.  [4] No Known Allergies [5] No family history on file.

## 2024-08-28 NOTE — Discharge Summary (Addendum)
 Nephrology Inpatient Discharge Note     Discharge Diagnosis   Admission Date:   08/26/2024  7:04 PM                      Attending: Emaline Gerri Amend, MD   Discharge Date:   08/28/2024                 Consultants:     DISCHARGE DIAGNOSIS: Principal Problem:   AKI (acute kidney injury) Resolved Problems:   * No resolved hospital problems. *  1. S.p kidney transplant 2. Immune suppression  3. Immune prophylaxis  4. CMV viremia : resolved. 5. Anemia  6. Hypertension    [name], MD [facility]  Discharge Summary   Erin Crawford is a 63 y.o. 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    Recipient history CPRA 90% Port score 4.4% EPTS 85% HLA mismatch 2-2-1 CMV +D/+R.  EBV +D/+R.  Toxoplasma -D/-R.    Induction:  Thymoglobulin    Her post-transplant course was noted for DGF. She is pre-sensitized with PRA 90%.  Post-transplant Luminex revealed low-level DSA (MFI 1900).  She underwent surveillance biopsy on 06/09/2024, which revealed thrombotic microangiopathy; moderate acute tubular injury; moderate arteriosclerosis; moderate arteriolar hyalinosis; mild IFTA; mild capillaritis (PTC1); no cellular rejection. The small intralobular arteries demonstrated segmental fibrinoid necrosis with accumulation of neutrophils and lymphocytes focally. Arteries demonstrated evidence of endothelial injury and activation. C4d staining was negative. MMDx revealed moderate AKI, no ABMR, no TCMR, mild inflammation and minimal atrophy-fibrosis.    She underwent PLEX x 3 (9/17, 9/19 and 9/20). She received full dose eculizumab  on 9/16 with supplemental doses on 9/17 and 9/19 (post-PLEX). She was switched to Ultomiris: first infusion on  9/24, next on 10/8, then every 8 weeks. She was on PD prior to transplant.  She received PLEX and hemodialysis via Vascath. Her last hemodialysis was on 9/23.  Hospital course noted symptomatic hypocalcemia, SVT, hypotension, bradycardia, and DGF. Required blood transfusion post-biopsy.  She was discharged on 06/18/2024.    He was rehospitalized from October 7-14, 2025.  She underwent kidney biopsy on 8 October.  It showed mild acute tubular injury with focal dystrophic calcification, moderate arteriosclerosis and mild IFTA.  No evidence of rejection.  No TMA.  C4d and BK staining are negative. She got her last dose of Ravulizumab on 8 October. She also had possible abdominal wall infection. Patient underwent ultrasound-guided drain placement on 10/9. Culture grew Staph epidermidis in both aerobic and anaerobic set. She was discharged on linezolid 600 mg twice a day to stop on October 19   Luminex on  06/20/2024 with DSA against DPB1 at 1200 MFI 11.25.25 : negative.   Maintenance immunosuppression   Tacrolimus  3 mg in AM and 2 mg in PM CellCept 500 mg : 2 in am and 1 in pm Prednisone 10 mg po daily. Ravulizumab last dose October 8    On Pen VK : till Jan 8 th 2026. Valacyte 450 mg po daily.  Pt admitted for kidney biopsy.  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 Final report with special stains and MMDX pending.  Once these are back, we will discuss Jannifer conversion. Pt  to be discharged today, labs next week Mon or Tue @ DGF clinic with appt to discuss further fu    Medication changes at the time of discharge   Leukopenia :   Valacyte 450 mg po bid. Cellcelpt 750/750    Follow up Instructions   Labs and OV mon or tue at DGF clinic MMF levels with next lab.  Examination   General: awake oriented x 3  Lungs: Clear anteriorly and laterally Cardiovascular: S1 S2 normal, No murmur rub or gallop,  Abdomen:  Soft, nontender Extremities: pedal edema  Neurological : no focal deficits noted  No data found.  Admission on 08/26/2024  Component Date Value Ref Range Status  . Sodium 08/26/2024 136  134 - 143 mmol/L Final  . Potassium 08/26/2024 4.4  3.5 - 5.1 mmol/L Final  . Chloride 08/26/2024 107  98 - 107 mmol/L Final  . CO2 08/26/2024 18 (L)  21 - 31 mmol/L Final  . Anion Gap 08/26/2024 11  3 - 15 mmol/L Final  . Glucose, Random 08/26/2024 330 (H)  70 - 125 mg/dL Final  . Blood Urea  Nitrogen (BUN) 08/26/2024 57 (H)  8 - 30 mg/dL Final  . Creatinine 87/97/7974 3.41 (H)  0.51 - 0.95 mg/dL Final  . eGFR 87/97/7974 15 (L)  >59 mL/min/1.51m2 Final  . Albumin 08/26/2024 3.7  3.7 - 4.8 g/dL Final  . Calcium  08/26/2024 9.2  8.6 - 10.3 mg/dL Final  . Phosphorus 87/97/7974 4.0  2.5 - 5.0 mg/dL Final  . BUN/Creatinine Ratio 08/26/2024 16.7  10.0 - 20.0 Final  . Glucose, POC 08/26/2024 343 (H)  70 - 110 mg/dL Final  . Prothrombin Time (PT) 08/27/2024 13.8  11.8 - 14.4 seconds Final  . International Normalized Ratio (IN* 08/27/2024 1.1    Final  . Sodium 08/27/2024 138  134 - 143 mmol/L Final  . Potassium 08/27/2024 4.3  3.5 - 5.1 mmol/L Final  . Chloride 08/27/2024 110 (H)  98 - 107 mmol/L Final  . CO2 08/27/2024 17 (L)  21 - 31 mmol/L Final  . Anion Gap 08/27/2024 11  3 - 15 mmol/L Final  . Glucose, Random 08/27/2024 138 (H)  70 - 125 mg/dL Final  . Blood Urea  Nitrogen (BUN) 08/27/2024 55 (H)  8 - 30 mg/dL Final  . Creatinine 87/96/7974 3.13 (H)  0.51 - 0.95 mg/dL Final  . eGFR 87/96/7974 16 (L)  >59 mL/min/1.62m2 Final  . Albumin 08/27/2024 3.5 (L)  3.7 - 4.8 g/dL Final  . Calcium  08/27/2024 9.0  8.6 - 10.3 mg/dL Final  . Phosphorus 87/96/7974 4.1  2.5 - 5.0 mg/dL Final  . BUN/Creatinine Ratio 08/27/2024 17.6  10.0 - 20.0 Final  . Corrected Calcium  08/27/2024 9.4  8.6 - 10.3 mg/dL Final  . CMV DNA 87/96/7974 Not Detected  Not Detected Final  . Methodology 08/27/2024    Final                    Value: cobasCMV is an in vitro nucleic acid amplification assay run on the [id number] system. Real time quantitative PCR enables the detection and quantification of CMV DNA in EDTA plasma of infected transplant patients.    . . 08/27/2024    Final                   Value:This test has been cleared by the U.S. Food and Drug Administration (FDA) and its performance characteristics have been verified by this laboratory. This laboratory is certified under  the Clinical Laboratory Improvement Amendments of 1988 (CLIA '88) as qualified to perform high complexity clinical testing. For instances where archival tissue specimens were used for testing, the collection date shown may not reflect the true collection date of the specimen. When applicable, please refer to the collection date of the original tissue case noted in this report.   . WBC 08/27/2024 1.72 (L)  3.60 - 11.70 10*3/uL Final  . RBC 08/27/2024 2.97 (L)  3.72 - 5.24 10*6/uL Final  . Hemoglobin 08/27/2024 9.9 (L)  10.8 - 15.5 g/dL Final  . Hematocrit 87/96/7974 29 (L)  34 - 46 % Final  . Mean Corpuscular Volume (MCV) 08/27/2024 98  80 - 100 fL Final  . Mean Corpuscular Hemoglobin (MCH) 08/27/2024 33  26 - 34 pg Final  . Mean Corpuscular Hemoglobin Conc (* 08/27/2024 34  32 - 35 g/dL Final  . Red Cell Distribution Width (RDW) 08/27/2024 19.1 (H)  12.2 - 16.6 % Final  . Platelet Count (PLT) 08/27/2024 160  153 - 400 10*3/uL Final  . Mean Platelet Volume (MPV) 08/27/2024 8.2  7.3 - 11.2 fL Final  . nRBC % 08/27/2024 0  0 - 0 % Final  . nRBC Absolute 08/27/2024 0.00  <=1.00 10*3/uL Final  . Neutrophils % 08/27/2024 79  % Final  . Lymphocytes % 08/27/2024 9  % Final  . Monocytes % 08/27/2024 8  % Final  . Eosinophils % 08/27/2024 0  % Final  . Basophils % 08/27/2024 0  % Final  . Bands % 08/27/2024 3  <5 % Final  . Myelocyte % 08/27/2024 1  % Final  . Neutrophil Absolute (Man Diff) 08/27/2024 1.36 (L)  1.60 - 8.20 10*3/uL Final  . Lymphocytes  Absolute (Man Diff) 08/27/2024 0.15 (L)  1.07 - 3.45 10*3/uL Final  . Monocytes Absolute (Man Diff) 08/27/2024 0.14 (L)  0.20 - 0.85 10*3/uL Final  . Eosinophils Absolute (Man Diff) 08/27/2024 0.00  <0.30 10*3/uL Final  . Basophils Absolute (Man Diff) 08/27/2024 0.00  <0.10 10*3/uL Final  . Bands Absolute (Man Diff) 08/27/2024 0.05 (H)  <0.05 10*3/uL Final  . Myelocytes Absolute (Man Diff) 08/27/2024 0.02 (H)  0.00 10*3/uL Final  . RBC & PLT Morphology 08/27/2024 Reviewed   Final  . Dacrocytes/Tear Drop 08/27/2024 1+   Final  . Ovalocytes 08/27/2024 1+   Final  . Giant Platelets 08/27/2024 Present   Final  . Glucose, POC 08/27/2024 139 (H)  70 - 110 mg/dL Final  . Glucose, POC 87/96/7974 159 (H)  70 - 110 mg/dL Final  . Glucose, POC 87/96/7974 184 (H)  70 - 110 mg/dL Final  . Glucose, POC 87/96/7974 179 (H)  70 - 110 mg/dL Final     Lab Results  Component Value Date   CREATININE 3.13 (H) 08/27/2024   BUN 55 (H) 08/27/2024   NA 138 08/27/2024   K 4.3 08/27/2024   CL 110 (H) 08/27/2024   CO2 17 (L) 08/27/2024    Lab Results  Component Value Date   WBC 1.72 (L) 08/27/2024   HGB 9.9 (L) 08/27/2024   HCT 29 (L) 08/27/2024   MCV 98 08/27/2024   PLT 160 08/27/2024    I/O last 3 completed shifts: In: 717.4 [P.O.:420; I.V.:272.4; IV Piggyback:25] Out: 400 [Urine:400] I/O this shift: In: 240 [P.O.:240] Out: 300 [Urine:300]   @currentmeds @

## 2024-09-01 NOTE — Progress Notes (Unsigned)
 Cardiology Clinic Note   Date: 09/01/2024 ID: GHAZAL PEVEY, DOB 04-Sep-1961, MRN [id number]  Primary Cardiologist:  Redell Cave, MD  Chief Complaint   REGINNA SERMENO is a 63 y.o. female who presents to the clinic today for ***  Patient Profile   Crosby Bevan Belt is followed by *** for the history outlined below.      Past medical history significant for: Palpitations. 14-day ZIO 02/25/2021: HR 50 to 150 bpm, average 72 bpm.  3 runs of SVT fastest 5 beats max rate 150 bpm, longest 10 seconds average rate 131 bpm.  Rare ectopy. Cardiac murmur. Echo 07/04/2023: EF 55 to 60%.  No RWMA.  Severe LVH.  Grade I DD.  Normal RV size/function.  Normal PA pressure, RVSP 28 mmHg.  Mild LAE.  Small pericardial effusion but no evidence of cardiac tamponade.  Mild MR. Hypertension. Hyperlipidemia. Lipid panel 06/20/2024: LDL 71, HDL 62, TG 71, total 148. OSA. T2DM. ESRD. S/p kidney transplant 06/03/2024.  In summary, patient was previously followed by Dr. Hester.  She establish care with Dr. Cave on 02/04/2021.  She complained of palpitation and occasional falls.  Orthostatic vitals were negative.  BP was elevated at 160/100.  Carvedilol  was creased to 12.5 mg daily.  She wore a 14-day ZIO event demonstrated 3 runs of SVT.  Echo on October 2023 demonstrated EF 65 to 70%, no RWMA, mild concentric LVH, Grade I DD, normal RV size/function, normal PA pressure, moderate LAE, small pericardial effusion with no evidence of cardiac tamponade, mild MR, moderate MAC, mild aortic valve calcification/thickening with mild aortic stenosis.  Patient was being worked up by the [facility] renal transplant team.  Stress echo in October 2023 was a low risk study.  Patient was last seen in the office by Dr. Gollan on 06/21/2023 for routine follow-up.  She required diagnostic cardiac testing to stay on renal transplant list.  She underwent echo which demonstrated normal LV/RV function.  She had a nuclear stress  test which was a normal, low risk study.  Patient understand kidney transplant in September 2025.     History of Present Illness    Today, patient ***  Palpitations 14-day ZIO June 2022 demonstrated HR 50 to 150 bpm, average 72 bpm, 3 runs of SVT fastest 5 beats max rate 150 bpm, longest 10 seconds average rate 131 bpm.  Patient*** - Continue carvedilol .  Cardiac murmur Echo October 2024 demonstrated normal LV/RV function, severe LVH, Grade I DD, normal PA pressure, mild LAE, small pericardial effusion with no evidence of cardiac tamponade, mild MR.  Patient*** -***  Hypertension BP today*** - Continue amlodipine , carvedilol , losartan , hydralazine .  Hyperlipidemia LDL 71 September 2025, at goal. - Continue rosuvastatin .  ROS: All other systems reviewed and are otherwise negative except as noted in History of Present Illness.  EKGs/Labs Reviewed        No results found for requested labs within last 365 days.   No results found for requested labs within last 365 days.   No results found for requested labs within last 365 days.   No results found for requested labs within last 365 days.  ***  Risk Assessment/Calculations    {Does this patient have ATRIAL FIBRILLATION?:[phone]} No BP recorded.  {Refresh Note OR Click here to enter BP  :1}***        Physical Exam    VS:  There were no vitals taken for this visit. , BMI There is no height or weight on file to  calculate BMI.  GEN: Well nourished, well developed, in no acute distress. Neck: No JVD or carotid bruits. Cardiac: *** RRR. *** No murmur. No rubs or gallops.   Respiratory:  Respirations regular and unlabored. Clear to auscultation without rales, wheezing or rhonchi. GI: Soft, nontender, nondistended. Extremities: Radials/DP/PT 2+ and equal bilaterally. No clubbing or cyanosis. No edema ***  Skin: Warm and dry, no rash. Neuro: Strength intact.  Assessment & Plan   ***  Disposition: ***     {Are you  ordering a CV Procedure (e.g. stress test, cath, DCCV, TEE, etc)?   Press F2        :[id number]}   Signed, Barnie HERO. Taunja Brickner, DNP, NP-C

## 2024-09-01 NOTE — Progress Notes (Signed)
 Mrs. Amburn in this AM and reports she has been very fatigued/tired in the last days or two and that her appetite has been diminished this weekend.  She is not positive for CMV at this point.  She also states that her heart rate has been elevated (120s).  She is on Coreg  25mg  with good BP's.  She happens to see her cardiologist on Wednesday.  Dr. Kochar asked that she half dose (12.6mg ) her Coreg  at this point and to check her BP consistently.   Dr. Kochar addressed her blood sugars and provided guidance and additional dosing to manage her spikes mid-day. She called me Friday with noted pink urine post her kidney biopsy on the prior Wednesday.  This clear after a few voids by Saturday morning.  No other complaints. She recently was switched to Cellcept 750mg  BID.  No diarrhea or constipation.   Dr. Kochar and Kevin Cooper, Pharm D discussed Belatacept conversion with her and the plans for infusions. Will will get labs next week locally 09/08/24 and plan to see her back on 09/15/24. Today's lab results reviewed with Dr. Kochar.  No changes to Prograf  dosage.

## 2024-09-03 ENCOUNTER — Encounter: Payer: Self-pay | Admitting: Student

## 2024-09-03 ENCOUNTER — Ambulatory Visit: Attending: Student | Admitting: Student

## 2024-09-03 VITALS — BP 136/70 | HR 64 | Resp 18 | Ht 62.0 in | Wt 176.0 lb

## 2024-09-03 DIAGNOSIS — R011 Cardiac murmur, unspecified: Secondary | ICD-10-CM

## 2024-09-03 DIAGNOSIS — R002 Palpitations: Secondary | ICD-10-CM

## 2024-09-03 DIAGNOSIS — I10 Essential (primary) hypertension: Secondary | ICD-10-CM

## 2024-09-03 DIAGNOSIS — E785 Hyperlipidemia, unspecified: Secondary | ICD-10-CM | POA: Diagnosis not present

## 2024-09-03 DIAGNOSIS — E1169 Type 2 diabetes mellitus with other specified complication: Secondary | ICD-10-CM

## 2024-09-03 NOTE — Patient Instructions (Signed)
 Medication Instructions:   Your physician recommends that you continue on your current medications as directed. Please refer to the Current Medication list given to you today.    *If you need a refill on your cardiac medications before your next appointment, please call your pharmacy*  Lab Work:  None ordered at this time   If you have labs (blood work) drawn today and your tests are completely normal, you will receive your results only by:  MyChart Message (if you have MyChart) OR  A paper copy in the mail If you have any lab test that is abnormal or we need to change your treatment, we will call you to review the results.  Testing/Procedures:  None ordered at this time   Referrals:  None ordered at this time   Follow-Up:  At [facility], you and your health needs are our priority.  As part of our continuing mission to provide you with exceptional heart care, our providers are all part of one team.  This team includes your primary Cardiologist (physician) and Advanced Practice Providers or APPs (Physician Assistants and Nurse Practitioners) who all work together to provide you with the care you need, when you need it.  Your next appointment:   5 - 6 month(s)  Provider:    You may see Redell Cave, MD or one of the following Advanced Practice Providers on your designated Care Team:   Lonni Meager, NP Lesley Maffucci, PA-C Bernardino Bring, PA-C Cadence Northlakes, PA-C Tylene Lunch, NP Barnie Hila, NP    We recommend signing up for the patient portal called MyChart.  Sign up information is provided on this After Visit Summary.  MyChart is used to connect with patients for Virtual Visits (Telemedicine).  Patients are able to view lab/test results, encounter notes, upcoming appointments, etc.  Non-urgent messages can be sent to your provider as well.   To learn more about what you can do with MyChart, go to [email or web address].   Other Instructions Please  bring a copy of your current medications to our office.

## 2025-02-09 ENCOUNTER — Ambulatory Visit: Admitting: Student
